# Patient Record
Sex: Male | Born: 1970 | Race: White | Hispanic: No | Marital: Married | Smoking: Former smoker
Health system: Southern US, Community
[De-identification: ages and names within clinical notes are randomized; demographics above are authoritative.]

## PROBLEM LIST (undated history)

## (undated) DIAGNOSIS — G4700 Insomnia, unspecified: Secondary | ICD-10-CM

## (undated) DIAGNOSIS — R112 Nausea with vomiting, unspecified: Secondary | ICD-10-CM

## (undated) DIAGNOSIS — E785 Hyperlipidemia, unspecified: Secondary | ICD-10-CM

## (undated) DIAGNOSIS — M5090 Cervical disc disorder, unspecified, unspecified cervical region: Secondary | ICD-10-CM

## (undated) DIAGNOSIS — K219 Gastro-esophageal reflux disease without esophagitis: Secondary | ICD-10-CM

## (undated) DIAGNOSIS — Z9289 Personal history of other medical treatment: Secondary | ICD-10-CM

## (undated) DIAGNOSIS — I499 Cardiac arrhythmia, unspecified: Secondary | ICD-10-CM

## (undated) DIAGNOSIS — Z9889 Other specified postprocedural states: Secondary | ICD-10-CM

## (undated) DIAGNOSIS — I480 Paroxysmal atrial fibrillation: Secondary | ICD-10-CM

## (undated) DIAGNOSIS — M1990 Unspecified osteoarthritis, unspecified site: Secondary | ICD-10-CM

## (undated) DIAGNOSIS — I10 Essential (primary) hypertension: Secondary | ICD-10-CM

## (undated) DIAGNOSIS — Z8489 Family history of other specified conditions: Secondary | ICD-10-CM

## (undated) HISTORY — DX: Cervical disc disorder, unspecified, unspecified cervical region: M50.90

## (undated) HISTORY — DX: Paroxysmal atrial fibrillation: I48.0

## (undated) HISTORY — DX: Insomnia, unspecified: G47.00

## (undated) HISTORY — DX: Hyperlipidemia, unspecified: E78.5

## (undated) HISTORY — DX: Gastro-esophageal reflux disease without esophagitis: K21.9

## (undated) HISTORY — PX: KNEE ARTHROSCOPY: SHX127

## (undated) HISTORY — DX: Personal history of other medical treatment: Z92.89

## (undated) HISTORY — DX: Essential (primary) hypertension: I10

## (undated) HISTORY — PX: OTHER SURGICAL HISTORY: SHX169

---
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
## 1999-06-09 ENCOUNTER — Ambulatory Visit (HOSPITAL_COMMUNITY): Admission: RE | Admit: 1999-06-09 | Discharge: 1999-06-09 | Payer: Self-pay | Admitting: Orthopedic Surgery

## 1999-06-09 ENCOUNTER — Encounter: Payer: Self-pay | Admitting: Orthopedic Surgery

## 1999-07-11 ENCOUNTER — Ambulatory Visit (HOSPITAL_BASED_OUTPATIENT_CLINIC_OR_DEPARTMENT_OTHER): Admission: RE | Admit: 1999-07-11 | Discharge: 1999-07-11 | Payer: Self-pay | Admitting: Orthopedic Surgery

## 2000-07-28 ENCOUNTER — Encounter: Payer: Self-pay | Admitting: Family Medicine

## 2000-07-28 ENCOUNTER — Ambulatory Visit (HOSPITAL_COMMUNITY): Admission: RE | Admit: 2000-07-28 | Discharge: 2000-07-28 | Payer: Self-pay | Admitting: Family Medicine

## 2000-10-16 ENCOUNTER — Ambulatory Visit (HOSPITAL_COMMUNITY): Admission: RE | Admit: 2000-10-16 | Discharge: 2000-10-16 | Payer: Self-pay | Admitting: Family Medicine

## 2000-10-16 ENCOUNTER — Encounter: Payer: Self-pay | Admitting: Family Medicine

## 2001-07-06 ENCOUNTER — Emergency Department (HOSPITAL_COMMUNITY): Admission: EM | Admit: 2001-07-06 | Discharge: 2001-07-06 | Payer: Self-pay | Admitting: Emergency Medicine

## 2001-07-06 ENCOUNTER — Encounter: Payer: Self-pay | Admitting: Emergency Medicine

## 2003-05-16 ENCOUNTER — Ambulatory Visit (HOSPITAL_BASED_OUTPATIENT_CLINIC_OR_DEPARTMENT_OTHER): Admission: RE | Admit: 2003-05-16 | Discharge: 2003-05-16 | Payer: Self-pay | Admitting: Orthopedic Surgery

## 2003-11-16 ENCOUNTER — Ambulatory Visit (HOSPITAL_BASED_OUTPATIENT_CLINIC_OR_DEPARTMENT_OTHER): Admission: RE | Admit: 2003-11-16 | Discharge: 2003-11-16 | Payer: Self-pay | Admitting: Orthopedic Surgery

## 2004-06-01 HISTORY — PX: CARDIAC CATHETERIZATION: SHX172

## 2009-12-14 ENCOUNTER — Encounter: Admission: RE | Admit: 2009-12-14 | Discharge: 2009-12-14 | Payer: Self-pay | Admitting: Cardiovascular Disease

## 2010-10-05 NOTE — Op Note (Signed)
NAME:  Jimmy Baldwin, Jimmy Baldwin                       ACCOUNT NO.:  [id number]   MEDICAL RECORD NO.:  [id number]                   PATIENT TYPE:  AMB   LOCATION:  DSC                                  FACILITY:  [facility]   PHYSICIAN:  Harvie Junior, M.D.                DATE OF BIRTH:  08/26/1970   DATE OF PROCEDURE:  05/16/2003  DATE OF DISCHARGE:                                 OPERATIVE REPORT   PREOPERATIVE DIAGNOSIS:  Impingement left shoulder.   POSTOPERATIVE DIAGNOSES:  1. Impingement left shoulder.  2. Anterior labral tear.   PROCEDURES:  1. Anterolateral acromioplasty.  2. Debridement of anterior labral tear.   SURGEON:  Harvie Junior, M.D.   ASSISTANT:  Marshia Ly, P.A.   ANESTHESIA:  General.   INDICATIONS FOR PROCEDURE:  He is a 40 year old male with a long history of  having had a right shoulder subacromial decompression. He has done well with  that. He began having left shoulder symptoms. We ultimately treated him  conservatively and with failure of conservative care we ultimately talked  about taking him to the operating room for arthroscopic acromioplasty, given  that he had a type acromion with failure of conservative care. He is brought  to the operating room for this procedure.   DESCRIPTION OF PROCEDURE:  The patient was brought to the operating room and  after adequate anesthesia was obtained with general anesthesia the patient  was placed supine on the operating table. The left shoulder was prepped and  draped in the usual sterile fashion.   Following this an arthroscopic examination  of the bony humeral joint  revealed there was an obvious anterior labral tear which was debrided. The  rotator cuff undersurface was evaluated and noted to be within normal  limits.   Attention was turned out of the glenohumeral joint and into the subacromial  space where an anterolateral acromioplasty was performed. A subtotal  bursectomy was then performed. The shoulder was  put through a range of  motion and there were no limitations to motion, no impingement noted at this  point.   Following  this the patient was taken to the recovery room where he was  noted to be in satisfactory condition. Estimated blood loss for this  procedure was none.                                               Harvie Junior, M.D.   Ranae Plumber  D:  05/16/2003  T:  05/16/2003  Job:  [id number]

## 2010-10-05 NOTE — Op Note (Signed)
NAME:  Jimmy Baldwin, Jimmy Baldwin                       ACCOUNT NO.:  [id number]   MEDICAL RECORD NO.:  [id number]                   PATIENT TYPE:  AMB   LOCATION:  DSC                                  FACILITY:  [facility]   PHYSICIAN:  Harvie Junior, M.D.                DATE OF BIRTH:  August 25, 1970   DATE OF PROCEDURE:  11/16/2003  DATE OF DISCHARGE:                                 OPERATIVE REPORT   PREOPERATIVE DIAGNOSIS:  Acromioclavicular joint arthritis, impingement,  possible rotator cuff tear.   POSTOPERATIVE DIAGNOSIS:  1. Rotator cuff tear.  2. Acromioclavicular joint arthritis.   OPERATION PERFORMED:  1. Miniopen rotator cuff repair of a chronically partially torn rotator     cuff.  2. Distal clavicle resection through an anterior arthroscopic portal.   SURGEON:  Harvie Junior, M.D.   ASSISTANT:  Marshia Ly, P.A.   ANESTHESIA:  General.   INDICATIONS FOR PROCEDURE:  The patient is a 40 year old male with a long  history of having had left shoulder pain.  He was evaluated and underwent  subacromial decompression with bursectomy.  We did note at that time that he  had a small partial thickness rotator cuff tear.  This had been left alone.  He postoperatively had had physical therapy and continued to have pain in  the lateral aspect of the shoulder.  Repeat MRI showed no full thickness  tearing area.  Because of continued complaints of pain, failure of  conservative care, he was ultimately taken back for repeat evaluation.   DESCRIPTION OF PROCEDURE:  The patient was taken to the operating room.  After adequate anesthesia was obtained with a general anesthetic, the  patient was placed on the operating table.  The left shoulder was then  prepped and draped in the usual sterile fashion.  Following this, routine  arthroscopic examination of the shoulder revealed biceps tendon was within  normal limits.  A little bit of tear of the superior labrum which was  debrided. The  anterior capsular structures were fine.  No glenohumeral  arthritis.  Attention was turned to the rotator cuff where the small area  which we had seen before was identified and we debrided this and with  debridement, did appear to be a small, probably 7 to 8 mm full thickness  rotator cuff tear at least from the humeral size.  We put a PDS suture in  this area and then evaluated this from the top side.  On the top side it did  appear to have a small full thickness tear.  At this point, the  acromioplasty was performed.  Distal clavicle resection was performed  through an anterior portal and then attention was turned towards a mini open  rotator cuff repair.  A small incision was made over the lateral aspect of  the shoulder and the deltoid was identified and divided and the rotator cuff  tear was identified.  A small incision was made in the rotator cuff to make  about a 1 cm tear and the joining underneath sections were debrided.  The  bone was debrided with a rongeur and a 5 mm suture anchor was placed at this  point and anterior and posterior sutures were placed, repairing the rotator  cuff.  At this point the arm was put through a range of motion, no tendency  toward impingement or instability.  Shoulder was copiously irrigated and  suctioned dry.  The deltoid rent was closed with a  1 Vicryl running suture, the subcu with 0 and 2-0 Vicryl and 3-0 Maxon  subcuticular stitch.  Benzoin and Steri-Strips were applied.  Sterile  compressive dressing and the patient was taken to recovery room where he was  noted to be in satisfactory condition.  The estimated blood loss was none.                                               Harvie Junior, M.D.    Ranae Plumber  D:  11/16/2003  T:  11/16/2003  Job:  [id number]

## 2010-10-05 NOTE — Op Note (Signed)
[facility]  Patient:    Jimmy Baldwin, Jimmy Baldwin                    MRN: [id number] Proc. Date: 07/11/99 Adm. Date:  [id number] Attending:  Milly Jakob CC:         Harvie Junior, M.D.                           Operative Report  PREOPERATIVE DIAGNOSIS: 1. Left knee patellofemoral pain with chondral injury. 2. Concern of meniscal tear. 3. Right knee anterior cruciate ligament laxity and concern over chondral    injury.  POSTOPERATIVE DIAGNOSIS: 1. Anterior cruciate ligament laxity status post anterior cruciate ligament    reconstruction 10 years ago. 2. Chondral injury, lateral femoral condyle. 3. Lateral meniscal tear.  OPERATION PERFORMED: 1. Debridement of lateral patellar chondromalacia. 2. Debridement of anterior horn medial meniscal tear. Right knee: 1. Debridement of posterior horn lateral meniscal tear. 2. Debridement of chondromalacia lateral femoral condyle. 3. Anterior cruciate ligament thermal modification.  SURGEON:  Harvie Junior, M.D.  ANESTHESIA:  General.  INDICATIONS FOR PROCEDURE:  The patient is a 40 year old male with a long history of having significant bilateral knee pain.  He has had previous surgery on the right knee including an initial debridement and then ultimately an ACL reconstruction.  He has done reasonably well although he has had two episodes of giving out.  He has had a lot of anterior knee pain on the right side with a lot of joint line pain, particularly laterally.  In terms of his left knee, he started  having worsening pain on that most recently with pain around the area of the patella on the lateral side and also on the anteromedial side.  Because of these findings, the patient was brought to the operating room for arthroscopy.  We talked about staging the procedures but the patient did wish to have both done at once and we did clue him in to the risk of the possibility of needing to be in  a wheelchair. He wanted to accept this risk and have both procedures performed at once and this was what was undertaken.  DESCRIPTION OF PROCEDURE:  The patient was taken to the operating room and after adequate anesthesia obtained with general anesthetic, the patient was placed supine on the operating table.  Both legs were then prepped and draped in the usual sterile fashion.  The left knee was addressed first by routine arthroscopy.  It was noted to have chondral fray in the lateral patellar facet.  This was debrided with a suction shaver and smoothed down with the suction shaver and attention was turned to the patellar tracking which was noted to be midline.  Attention was then turned to the medial compartment where there was noted to be an anterior medial meniscal tear.  This was debrided with a suction shaver.  The anterior cruciate was identified and noted to be normal.  The lateral compartment was identified at length and noted to have no full thickness chondral injury or softening.  The meniscus on the lateral side was within normal limits.  At this point the left nee was copiously irrigated and suctioned dry and attention was then turned to the right knee.  The right knee underwent a routine arthroscopic examination revealing the patellofemoral joint without significant abnormality.  There was midline tracking. The medial compartment was entered and noted to  have a dramatic medial shelf plica. This was debrided to gain access into the medial compartment.  This did track down and have communication with the anterior horn medial meniscus which was debrided. Following this, attention was turned to the notch where there was noted to be a  very lax anterior cruciate ligament.  Some synovium surrounding the fibers but ery lax ligament.  Following this, attention was turned to the lateral side where there was noted to be an osteochondral defect in the lateral femoral  condyle and a posterior horn lateral meniscal tear.  The camera was changed to the medial compartment and probe used in the lateral compartment, the meniscus tear was felt to be stable but did have a leading edge which appeared to be impinging in the condyle.  This was debrided with a straight-biting forcep and the meniscal rim as contoured down with a suction shaver.  Following this, attention was turned to he anterior portion of the lateral compartment where there was noted to be chondral injury.  This was debrided with a suction shaver and then the thermal modification probe was used to smooth the articular cartilage, care being taken not to leave  this on for any significant length of time.  Following this, the synovial tissue around the anterior cruciate ligament was removed and the anterior cruciate ligament was identified at that point.  It was noted to have significant laxity  through anterior drawer testing as well as laxity with a probe.  Following this, the thermal modification probe was used on the 5 mm Jowana Thumma setting and the anterior cruciate ligament fibers were shrunk with gentle technique of painting the fibers.  After every few passes, the knee was put through a Lachman test and noted to be not too tight.  The knee was put through a range of motion of 90 to 0 and  noted to have no significant overtightening.  With the knee in 30 degrees of flexion after thermal modification was undertaken, the ligament was probed and elt to be stable.  Following this, a final check was made, both medial and lateral  the patellofemoral compartment and the knee was copiously irrigated and suctioned dry.  The arthroscopic portals were then closed with Steri-Strips on both knees. 15 cc of 0.25% Marcaine was instilled into both knees for postoperative anesthesia. The patient was taken to the recovery room with sterile compressive dressings on both sides, knee immobilizer on the  right side, where he was noted to be in satisfactory condition.  Estimated blood loss for this procedure was none. DD:  07/11/99 TD:  07/11/99 Job: [id number]

## 2011-03-07 DIAGNOSIS — Z9289 Personal history of other medical treatment: Secondary | ICD-10-CM

## 2011-03-07 HISTORY — DX: Personal history of other medical treatment: Z92.89

## 2011-06-20 ENCOUNTER — Ambulatory Visit (INDEPENDENT_AMBULATORY_CARE_PROVIDER_SITE_OTHER)
Admission: RE | Admit: 2011-06-20 | Discharge: 2011-06-20 | Disposition: A | Discharge status: Home / Self Care | Payer: BC Managed Care – PPO | Source: Ambulatory Visit | Attending: Internal Medicine | Admitting: Internal Medicine

## 2011-06-20 ENCOUNTER — Encounter: Payer: Self-pay | Admitting: Internal Medicine

## 2011-06-20 ENCOUNTER — Other Ambulatory Visit (INDEPENDENT_AMBULATORY_CARE_PROVIDER_SITE_OTHER): Payer: BC Managed Care – PPO

## 2011-06-20 ENCOUNTER — Ambulatory Visit (INDEPENDENT_AMBULATORY_CARE_PROVIDER_SITE_OTHER): Payer: BC Managed Care – PPO | Admitting: Internal Medicine

## 2011-06-20 VITALS — BP 112/72 | HR 64 | Temp 97.1°F | Ht 69.0 in | Wt 214.4 lb

## 2011-06-20 DIAGNOSIS — R062 Wheezing: Secondary | ICD-10-CM

## 2011-06-20 DIAGNOSIS — Z0001 Encounter for general adult medical examination with abnormal findings: Secondary | ICD-10-CM | POA: Insufficient documentation

## 2011-06-20 DIAGNOSIS — K219 Gastro-esophageal reflux disease without esophagitis: Secondary | ICD-10-CM | POA: Insufficient documentation

## 2011-06-20 DIAGNOSIS — Z0000 Encounter for general adult medical examination without abnormal findings: Secondary | ICD-10-CM

## 2011-06-20 DIAGNOSIS — M5030 Other cervical disc degeneration, unspecified cervical region: Secondary | ICD-10-CM | POA: Insufficient documentation

## 2011-06-20 DIAGNOSIS — E785 Hyperlipidemia, unspecified: Secondary | ICD-10-CM

## 2011-06-20 HISTORY — DX: Hyperlipidemia, unspecified: E78.5

## 2011-06-20 LAB — LIPID PANEL
HDL: 38.4 mg/dL — ABNORMAL LOW (ref >39.00)
Total CHOL/HDL Ratio: 5
Triglycerides: 272 mg/dL — ABNORMAL HIGH (ref 0.0–149.0)
VLDL: 54.4 mg/dL — ABNORMAL HIGH (ref 0.0–40.0)

## 2011-06-20 LAB — BASIC METABOLIC PANEL
Calcium: 9.6 mg/dL (ref 8.4–10.5)
GFR: 92.41 mL/min (ref >60.00)
Glucose, Bld: 86 mg/dL (ref 70–99)
Sodium: 139 mEq/L (ref 135–145)

## 2011-06-20 LAB — CBC WITH DIFFERENTIAL/PLATELET
Basophils Absolute: 0.1 10*3/uL (ref 0.0–0.1)
HCT: 44.7 % (ref 39.0–52.0)
Hemoglobin: 15.6 g/dL (ref 13.0–17.0)
Lymphs Abs: 2.1 10*3/uL (ref 0.7–4.0)
Monocytes Relative: 8.1 % (ref 3.0–12.0)
Neutro Abs: 4.2 10*3/uL (ref 1.4–7.7)
RDW: 11.9 % (ref 11.5–14.6)

## 2011-06-20 LAB — HEPATIC FUNCTION PANEL
Albumin: 4.8 g/dL (ref 3.5–5.2)
Alkaline Phosphatase: 84 U/L (ref 39–117)
Bilirubin, Direct: 0.2 mg/dL (ref 0.0–0.3)
Total Bilirubin: 1.1 mg/dL (ref 0.3–1.2)

## 2011-06-20 LAB — URINALYSIS, ROUTINE W REFLEX MICROSCOPIC
Ketones, ur: NEGATIVE
Specific Gravity, Urine: 1.025 (ref 1.000–1.030)
Urine Glucose: NEGATIVE
pH: 6 (ref 5.0–8.0)

## 2011-06-20 LAB — PSA: PSA: 0.29 ng/mL (ref 0.10–4.00)

## 2011-06-20 MED ORDER — ROSUVASTATIN CALCIUM 10 MG PO TABS: ORAL_TABLET | ORAL | Status: DC

## 2011-06-20 MED ORDER — PREDNISONE 10 MG PO TABS: ORAL_TABLET | ORAL | Status: DC

## 2011-06-20 NOTE — Assessment & Plan Note (Signed)
With dyspnea, exam with mild findings at best, suspect recent URI/bronchits possibly viral with mild wheezing since;  For predpack asd, but also for cxr, ecg, and labs (to also to be forwarded to Dr Henrine Screws)

## 2011-06-20 NOTE — Progress Notes (Signed)
Subjective:    Patient ID: Jimmy Baldwin, male    DOB: 12-12-1970, 41 y.o.   MRN: [id number]  HPI  Here for wellness and f/u;  Overall doing ok;  Pt denies CP, worsening SOB, DOE, wheezing, orthopnea, PND, worsening LE edema, palpitations, dizziness or syncope.  Pt denies neurological change such as new Headache, facial or extremity weakness.  Pt denies polydipsia, polyuria, or low sugar symptoms. Pt states overall good compliance with treatment and medications, good tolerability, and trying to follow lower cholesterol diet.  Pt denies worsening depressive symptoms, suicidal ideation or panic. No fever, wt loss, night sweats, loss of appetite, or other constitutional symptoms.  Pt states good ability with ADL's, low fall risk, home safety reviewed and adequate, no significant changes in hearing or vision, and occasionally active with exercise.  Here with sob/wheezing unusual for him for 2wks, with nighttime and latr in the day is worse. No signficant nasal allergy sz/ No hx of asthma or other pulm dz. Past Medical History  Diagnosis Date  . Cervical disc disease     per MRI july 2011  . GERD (gastroesophageal reflux disease)   . Hyperlipidemia 06/20/2011   Past Surgical History  Procedure Date  . Left shoulder surgury     x 3  . Right shoudler surgury     x 1  . Right knee surgury      x 4  - twice with ACL tear    reports that he has quit smoking. He has never used smokeless tobacco. He reports that he does not drink alcohol or use illicit drugs. family history includes Diabetes in his other; Heart disease in his other; and Hypertension in his other. No Known Allergies No current outpatient prescriptions on file prior to visit.   Review of Systems Review of Systems  Constitutional: Negative for diaphoresis, activity change, appetite change and unexpected weight change.  HENT: Negative for hearing loss, ear pain, facial swelling, mouth sores and neck stiffness.   Eyes: Negative for  pain, redness and visual disturbance.  Respiratory: Negative for shortness of breath and wheezing.   Cardiovascular: Negative for chest pain and palpitations.  Gastrointestinal: Negative for diarrhea, blood in stool, abdominal distention and rectal pain.  Genitourinary: Negative for hematuria, flank pain and decreased urine volume.  Musculoskeletal: Negative for myalgias and joint swelling.  Skin: Negative for color change and wound.  Neurological: Negative for syncope and numbness.  Hematological: Negative for adenopathy.  Psychiatric/Behavioral: Negative for hallucinations, self-injury, decreased concentration and agitation.      Objective:   Physical Exam BP 112/72  Pulse 64  Temp(Src) 97.1 F (36.2 C) (Oral)  Ht 5\' 9"  (1.753 m)  Wt 214 lb 6 oz (97.24 kg)  BMI 31.66 kg/m2  SpO2 97% Physical Exam  VS noted, not ill appearing Constitutional: Pt is oriented to person, place, and time. Appears well-developed and well-nourished.  HENT:  Head: Normocephalic and atraumatic.  Right Ear: External ear normal.  Left Ear: External ear normal.  Nose: Nose normal.  Mouth/Throat: Oropharynx is clear and moist. Bilat tm's mild erythema.  Sinus nontender.  Pharynx mild erythema  Eyes: Conjunctivae and EOM are normal. Pupils are equal, round, and reactive to light.  Neck: Normal range of motion. Neck supple. No JVD present. No tracheal deviation present.  Cardiovascular: Normal rate, regular rhythm, normal heart sounds and intact distal pulses.   Pulmonary/Chest: Effort normal and breath sounds mild decreased bilat, no rales or wheezing  Abdominal: Soft. Bowel sounds  are normal. There is no tenderness.  Musculoskeletal: Normal range of motion. Exhibits no edema.  Lymphadenopathy:  Has no cervical adenopathy.  Neurological: Pt is alert and oriented to person, place, and time. Pt has normal reflexes. No cranial nerve deficit.  Skin: Skin is warm and dry. No rash noted.  Psychiatric:  Has   normal mood and affect. Behavior is normal.     Assessment & Plan:

## 2011-06-20 NOTE — Patient Instructions (Addendum)
Your EKG was normal today Take all new medications as prescribed  - the prednisone - (sent to your pharmacy) Please go to XRAY in the Basement for the x-ray test Please go to LAB in the Basement for the blood and/or urine tests to be done today Please call the phone number [phone] (the [employer] System) for results of testing in 2-3 days;  When calling, simply dial the number, and when prompted enter the MRN number above (the Medical Record Number) and the # key, then the message should start. Please keep your appointments with your specialists as you have planned - Dr Allyson Sabal; (we will fax todays labs and cxr and ecg)  Please return in 1 year for your yearly visit, or sooner if needed, with Lab testing done 3-5 days before

## 2011-06-20 NOTE — Assessment & Plan Note (Signed)

## 2011-06-22 ENCOUNTER — Encounter: Payer: Self-pay | Admitting: Internal Medicine

## 2012-01-16 ENCOUNTER — Other Ambulatory Visit: Payer: Self-pay | Admitting: Orthopedic Surgery

## 2012-01-16 ENCOUNTER — Encounter (HOSPITAL_BASED_OUTPATIENT_CLINIC_OR_DEPARTMENT_OTHER): Payer: Self-pay | Admitting: *Deleted

## 2012-01-16 NOTE — Progress Notes (Signed)
No labs needed Sees dr berry for abn ekg-stress test nl- Family hx cad

## 2012-01-17 ENCOUNTER — Encounter (HOSPITAL_BASED_OUTPATIENT_CLINIC_OR_DEPARTMENT_OTHER)
Admission: RE | Disposition: A | Discharge status: Home / Self Care | Payer: Self-pay | Source: Ambulatory Visit | Attending: Orthopedic Surgery

## 2012-01-17 ENCOUNTER — Encounter (HOSPITAL_BASED_OUTPATIENT_CLINIC_OR_DEPARTMENT_OTHER): Payer: Self-pay | Admitting: *Deleted

## 2012-01-17 ENCOUNTER — Encounter (HOSPITAL_BASED_OUTPATIENT_CLINIC_OR_DEPARTMENT_OTHER): Payer: Self-pay | Admitting: Anesthesiology

## 2012-01-17 ENCOUNTER — Ambulatory Visit (HOSPITAL_BASED_OUTPATIENT_CLINIC_OR_DEPARTMENT_OTHER): Payer: BC Managed Care – PPO | Admitting: Anesthesiology

## 2012-01-17 ENCOUNTER — Ambulatory Visit (HOSPITAL_BASED_OUTPATIENT_CLINIC_OR_DEPARTMENT_OTHER)
Admission: RE | Admit: 2012-01-17 | Discharge: 2012-01-17 | Disposition: A | Discharge status: Home / Self Care | Payer: BC Managed Care – PPO | Source: Ambulatory Visit | Attending: Orthopedic Surgery | Admitting: Orthopedic Surgery

## 2012-01-17 DIAGNOSIS — K219 Gastro-esophageal reflux disease without esophagitis: Secondary | ICD-10-CM | POA: Insufficient documentation

## 2012-01-17 DIAGNOSIS — M1990 Unspecified osteoarthritis, unspecified site: Secondary | ICD-10-CM | POA: Insufficient documentation

## 2012-01-17 DIAGNOSIS — G5600 Carpal tunnel syndrome, unspecified upper limb: Secondary | ICD-10-CM | POA: Insufficient documentation

## 2012-01-17 DIAGNOSIS — E785 Hyperlipidemia, unspecified: Secondary | ICD-10-CM | POA: Insufficient documentation

## 2012-01-17 HISTORY — PX: CARPAL TUNNEL RELEASE: SHX101

## 2012-01-17 HISTORY — DX: Other specified postprocedural states: R11.2

## 2012-01-17 HISTORY — DX: Unspecified osteoarthritis, unspecified site: M19.90

## 2012-01-17 HISTORY — DX: Other specified postprocedural states: Z98.890

## 2012-01-17 SURGERY — CARPAL TUNNEL RELEASE
Anesthesia: Monitor Anesthesia Care | Site: Hand | Laterality: Left | Wound class: Clean

## 2012-01-17 MED ORDER — OXYCODONE HCL 5 MG PO TABS: 5.0000 mg | ORAL_TABLET | Freq: Once | ORAL | Status: DC | PRN

## 2012-01-17 MED ORDER — DEXTROSE 5 % IV SOLN
3.0000 g | INTRAVENOUS | Status: AC
  Administered 2012-01-17: 2 g via INTRAVENOUS

## 2012-01-17 MED ORDER — CHLORHEXIDINE GLUCONATE 4 % EX LIQD: 60.0000 mL | Freq: Once | CUTANEOUS | Status: DC

## 2012-01-17 MED ORDER — ONDANSETRON HCL 4 MG/2ML IJ SOLN
INTRAMUSCULAR | Status: DC | PRN
  Administered 2012-01-17: 4 mg via INTRAVENOUS

## 2012-01-17 MED ORDER — PENTAZOCINE-NALOXONE 50-0.5 MG PO TABS: 1.0000 | ORAL_TABLET | ORAL | Status: AC | PRN

## 2012-01-17 MED ORDER — LACTATED RINGERS IV SOLN
INTRAVENOUS | Status: DC
Start: 2012-01-17 — End: 2012-01-17
  Administered 2012-01-17: 20 mL/h via INTRAVENOUS
  Administered 2012-01-17: 12:00:00 via INTRAVENOUS

## 2012-01-17 MED ORDER — HYDROMORPHONE HCL PF 1 MG/ML IJ SOLN
0.2500 mg | INTRAMUSCULAR | Status: DC | PRN
  Administered 2012-01-17: 0.5 mg via INTRAVENOUS

## 2012-01-17 MED ORDER — BUPIVACAINE HCL (PF) 0.25 % IJ SOLN
INTRAMUSCULAR | Status: DC | PRN
  Administered 2012-01-17: 7 mL

## 2012-01-17 MED ORDER — FENTANYL CITRATE 0.05 MG/ML IJ SOLN
INTRAMUSCULAR | Status: DC | PRN
  Administered 2012-01-17: 100 ug via INTRAVENOUS

## 2012-01-17 MED ORDER — PROPOFOL 10 MG/ML IV EMUL
INTRAVENOUS | Status: DC | PRN
  Administered 2012-01-17: 50 ug/kg/min via INTRAVENOUS

## 2012-01-17 MED ORDER — FENTANYL CITRATE 0.05 MG/ML IJ SOLN
25.0000 ug | INTRAMUSCULAR | Status: DC | PRN
  Administered 2012-01-17 (×3): 50 ug via INTRAVENOUS

## 2012-01-17 MED ORDER — MIDAZOLAM HCL 5 MG/5ML IJ SOLN
INTRAMUSCULAR | Status: DC | PRN
  Administered 2012-01-17: 2 mg via INTRAVENOUS

## 2012-01-17 MED ORDER — KETOROLAC TROMETHAMINE 30 MG/ML IJ SOLN
INTRAMUSCULAR | Status: DC | PRN
  Administered 2012-01-17: 30 mg via INTRAVENOUS

## 2012-01-17 MED ORDER — OXYCODONE HCL 5 MG/5ML PO SOLN: 5.0000 mg | Freq: Once | ORAL | Status: DC | PRN

## 2012-01-17 MED ORDER — LIDOCAINE HCL (CARDIAC) 20 MG/ML IV SOLN
INTRAVENOUS | Status: DC | PRN
  Administered 2012-01-17: 50 mg via INTRAVENOUS

## 2012-01-17 MED ORDER — LIDOCAINE HCL (PF) 0.5 % IJ SOLN
INTRAMUSCULAR | Status: DC | PRN
  Administered 2012-01-17: 35 mL via INTRATHECAL

## 2012-01-17 SURGICAL SUPPLY — 36 items
BANDAGE GAUZE ELAST BULKY 4 IN (GAUZE/BANDAGES/DRESSINGS) ×2 IMPLANT
BLADE SURG 15 STRL LF DISP TIS (BLADE) ×1 IMPLANT
BLADE SURG 15 STRL SS (BLADE) ×2
BNDG CMPR 9X4 STRL LF SNTH (GAUZE/BANDAGES/DRESSINGS)
BNDG COHESIVE 3X5 TAN STRL LF (GAUZE/BANDAGES/DRESSINGS) ×2 IMPLANT
BNDG ESMARK 4X9 LF (GAUZE/BANDAGES/DRESSINGS) IMPLANT
CHLORAPREP W/TINT 26ML (MISCELLANEOUS) ×2 IMPLANT
CLOTH BEACON ORANGE TIMEOUT ST (SAFETY) ×2 IMPLANT
CORDS BIPOLAR (ELECTRODE) ×2 IMPLANT
COVER MAYO STAND STRL (DRAPES) ×2 IMPLANT
COVER TABLE BACK 60X90 (DRAPES) ×2 IMPLANT
CUFF TOURNIQUET SINGLE 18IN (TOURNIQUET CUFF) ×2 IMPLANT
DRAPE EXTREMITY T 121X128X90 (DRAPE) ×2 IMPLANT
DRAPE SURG 17X23 STRL (DRAPES) ×2 IMPLANT
DRSG KUZMA FLUFF (GAUZE/BANDAGES/DRESSINGS) ×2 IMPLANT
GAUZE XEROFORM 1X8 LF (GAUZE/BANDAGES/DRESSINGS) ×2 IMPLANT
GLOVE BIO SURGEON STRL SZ 6.5 (GLOVE) ×2 IMPLANT
GLOVE BIOGEL M STRL SZ7.5 (GLOVE) ×1 IMPLANT
GLOVE SURG ORTHO 8.0 STRL STRW (GLOVE) ×2 IMPLANT
GOWN BRE IMP PREV XXLGXLNG (GOWN DISPOSABLE) ×2 IMPLANT
GOWN PREVENTION PLUS XLARGE (GOWN DISPOSABLE) ×2 IMPLANT
NEEDLE 27GAX1X1/2 (NEEDLE) ×1 IMPLANT
NS IRRIG 1000ML POUR BTL (IV SOLUTION) ×2 IMPLANT
PACK BASIN DAY SURGERY FS (CUSTOM PROCEDURE TRAY) ×2 IMPLANT
PAD CAST 3X4 CTTN HI CHSV (CAST SUPPLIES) ×1 IMPLANT
PADDING CAST ABS 4INX4YD NS (CAST SUPPLIES) ×1
PADDING CAST ABS COTTON 4X4 ST (CAST SUPPLIES) ×1 IMPLANT
PADDING CAST COTTON 3X4 STRL (CAST SUPPLIES) ×2
SPONGE GAUZE 4X4 12PLY (GAUZE/BANDAGES/DRESSINGS) ×2 IMPLANT
STOCKINETTE 4X48 STRL (DRAPES) ×2 IMPLANT
SUT VICRYL 4-0 PS2 18IN ABS (SUTURE) IMPLANT
SUT VICRYL RAPIDE 4/0 PS 2 (SUTURE) ×2 IMPLANT
SYR BULB 3OZ (MISCELLANEOUS) ×2 IMPLANT
SYR CONTROL 10ML LL (SYRINGE) ×1 IMPLANT
TOWEL OR 17X24 6PK STRL BLUE (TOWEL DISPOSABLE) ×2 IMPLANT
UNDERPAD 30X30 INCONTINENT (UNDERPADS AND DIAPERS) ×2 IMPLANT

## 2012-01-17 NOTE — Transfer of Care (Signed)
Immediate Anesthesia Transfer of Care Note  Patient: Jimmy Baldwin  Procedure(s) Performed: Procedure(s) (LRB): CARPAL TUNNEL RELEASE (Left)  Patient Location: PACU  Anesthesia Type: MAC and Regional  Level of Consciousness: awake, alert  and oriented  Airway & Oxygen Therapy: Patient Spontanous Breathing  Post-op Assessment: Report given to PACU RN and Post -op Vital signs reviewed and stable  Post vital signs: Reviewed and stable  Complications: No apparent anesthesia complications

## 2012-01-17 NOTE — Anesthesia Postprocedure Evaluation (Signed)
  Anesthesia Post-op Note  Patient: Jimmy Baldwin  Procedure(s) Performed: Procedure(s) (LRB): CARPAL TUNNEL RELEASE (Left)  Patient Location: PACU  Anesthesia Type: MAC and Bier block  Level of Consciousness: awake, alert  and oriented  Airway and Oxygen Therapy: Patient Spontanous Breathing  Post-op Pain: mild  Post-op Assessment: Post-op Vital signs reviewed, Patient's Cardiovascular Status Stable, Respiratory Function Stable, Patent Airway and No signs of Nausea or vomiting  Post-op Vital Signs: Reviewed and stable  Complications: No apparent anesthesia complications

## 2012-01-17 NOTE — Brief Op Note (Signed)
01/17/2012  1:10 PM  PATIENT:  Jimmy Baldwin  41 y.o. male  PRE-OPERATIVE DIAGNOSIS:  left cts   POST-OPERATIVE DIAGNOSIS:  left cts   PROCEDURE:  Procedure(s) (LRB): CARPAL TUNNEL RELEASE (Left)  SURGEON:  Surgeon(s) and Role:    * Nicki Reaper, MD - Primary  PHYSICIAN ASSISTANT:   ASSISTANTS: none   ANESTHESIA:   local and regional  EBL:  Total I/O In: 500 [I.V.:500] Out: -   BLOOD ADMINISTERED:none  DRAINS: none   LOCAL MEDICATIONS USED:  MARCAINE     SPECIMEN:  No Specimen  DISPOSITION OF SPECIMEN:  N/A  COUNTS:  YES  TOURNIQUET:   Total Tourniquet Time Documented: Forearm (Left) - 79 minutes  DICTATION: .Other Dictation: Dictation Number [id number]  PLAN OF CARE: Discharge to home after PACU  PATIENT DISPOSITION:  PACU - hemodynamically stable.

## 2012-01-17 NOTE — Anesthesia Procedure Notes (Signed)
Procedure Name: MAC Performed by: Awanda Wilcock W Pre-anesthesia Checklist: Patient identified, Timeout performed, Emergency Drugs available, Suction available and Patient being monitored Patient Re-evaluated:Patient Re-evaluated prior to induction Oxygen Delivery Method: Simple face mask       

## 2012-01-17 NOTE — Op Note (Signed)
Dictated number: [id number]

## 2012-01-17 NOTE — Anesthesia Preprocedure Evaluation (Signed)
Anesthesia Evaluation  Patient identified by MRN, date of birth, ID band Patient awake    Reviewed: Allergy & Precautions, H&P , NPO status , Patient's Chart, lab work & pertinent test results  History of Anesthesia Complications (+) PONV  Airway Mallampati: I TM Distance: >3 FB Neck ROM: Full    Dental No notable dental hx. (+) Teeth Intact and Dental Advisory Given   Pulmonary neg pulmonary ROS,  breath sounds clear to auscultation  Pulmonary exam normal       Cardiovascular negative cardio ROS  Rhythm:Regular Rate:Normal     Neuro/Psych negative neurological ROS  negative psych ROS   GI/Hepatic Neg liver ROS, GERD-  Controlled,  Endo/Other  negative endocrine ROS  Renal/GU negative Renal ROS  negative genitourinary   Musculoskeletal   Abdominal   Peds  Hematology negative hematology ROS (+)   Anesthesia Other Findings   Reproductive/Obstetrics negative OB ROS                           Anesthesia Physical Anesthesia Plan  ASA: II  Anesthesia Plan: MAC and Bier Block   Post-op Pain Management:    Induction: Intravenous  Airway Management Planned: Simple Face Mask  Additional Equipment:   Intra-op Plan:   Post-operative Plan:   Informed Consent: I have reviewed the patients History and Physical, chart, labs and discussed the procedure including the risks, benefits and alternatives for the proposed anesthesia with the patient or authorized representative who has indicated his/her understanding and acceptance.   Dental advisory given  Plan Discussed with: CRNA  Anesthesia Plan Comments:         Anesthesia Quick Evaluation
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
## 2012-01-18 NOTE — Op Note (Signed)
NAME:  IZREAL, KOCK NO.:  [id number]  MEDICAL RECORD NO.:  [id number]  LOCATION:                                 FACILITY:  PHYSICIAN:  Cindee Salt, M.D.            DATE OF BIRTH:  DATE OF PROCEDURE:  01/17/2012 DATE OF DISCHARGE:                              OPERATIVE REPORT   PREOPERATIVE DIAGNOSIS:  Carpal tunnel syndrome, left hand.  POSTOPERATIVE DIAGNOSIS:  Carpal tunnel syndrome, left hand.  OPERATION:  Decompression of left median nerve.  SURGEON:  Cindee Salt, MD  ANESTHESIA:  Forearm-based IV regional with local infiltration.  ANESTHESIOLOGIST:  Zenon Mayo, MD  HISTORY:  The patient is a 41 year old male with history of carpal tunnel syndrome.  EMG nerve conduction is positive.  He has elected to undergo surgical decompression.  Pre, peri, and postoperative course have been discussed along with risks and complications.  He is aware that there is no guarantee with surgery; possibility of infection; recurrence of injury to arteries, nerves, tendons; incomplete relief of symptoms, and dystrophy.  In the preoperative area, the patient is seen, the extremity marked by both the patient and surgeon, and antibiotic given.  PROCEDURE:  The patient was brought to the operating room where forearm- based IV regional anesthetic was carried out without difficulty.  He was prepped using ChloraPrep, supine position, left arm free.  A 3-minute dry time was allowed.  Time-out taken, confirming the patient and procedure.  A longitudinal incision was made in the palm, carried down through the subcutaneous tissue.  Bleeders were electrocauterized. Palmar fascia was split.  Superficial palmar arch was identified.  The flexor tendon of the ring and little finger were identified to the ulnar side of the median nerve.  Carpal retinaculum was incised with sharp dissection.  Right angle and Sewall retractor were placed between the skin and forearm fascia.   The fascia was released for approximately 1.5 cm proximal to the wrist crease under direct vision.  Canal was explored.  Air compression to the nerve.  An hourglass deformity was noted.  No further lesions were identified.  The wound was irrigated. The skin was closed with interrupted 4-0 Vicryl Rapide sutures.  Local infiltration with 0.25% Marcaine without epinephrine was given, 7 mL was used.  Sterile compressive dressing with fingers free was applied.  On deflation of the tourniquet, all fingers immediately pinked.  He was taken to the recovery room for observation in satisfactory condition.          ______________________________ Cindee Salt, M.D.     GK/MEDQ  D:  01/17/2012  T:  01/18/2012  Job:  [id number]

## 2012-01-21 ENCOUNTER — Encounter (HOSPITAL_BASED_OUTPATIENT_CLINIC_OR_DEPARTMENT_OTHER): Payer: Self-pay | Admitting: Orthopedic Surgery

## 2012-01-22 LAB — POCT HEMOGLOBIN-HEMACUE: Hemoglobin: 13 g/dL (ref 13.0–17.0)

## 2012-02-19 ENCOUNTER — Encounter (HOSPITAL_BASED_OUTPATIENT_CLINIC_OR_DEPARTMENT_OTHER): Payer: Self-pay | Admitting: *Deleted

## 2012-02-19 ENCOUNTER — Other Ambulatory Visit: Payer: Self-pay | Admitting: Orthopedic Surgery

## 2012-02-21 ENCOUNTER — Ambulatory Visit (HOSPITAL_BASED_OUTPATIENT_CLINIC_OR_DEPARTMENT_OTHER): Payer: BC Managed Care – PPO | Admitting: Certified Registered"

## 2012-02-21 ENCOUNTER — Encounter (HOSPITAL_BASED_OUTPATIENT_CLINIC_OR_DEPARTMENT_OTHER)
Admission: RE | Disposition: A | Discharge status: Home / Self Care | Payer: Self-pay | Source: Ambulatory Visit | Attending: Orthopedic Surgery

## 2012-02-21 ENCOUNTER — Encounter (HOSPITAL_BASED_OUTPATIENT_CLINIC_OR_DEPARTMENT_OTHER): Payer: Self-pay | Admitting: Certified Registered"

## 2012-02-21 ENCOUNTER — Ambulatory Visit (HOSPITAL_BASED_OUTPATIENT_CLINIC_OR_DEPARTMENT_OTHER)
Admission: RE | Admit: 2012-02-21 | Discharge: 2012-02-21 | Disposition: A | Discharge status: Home / Self Care | Payer: BC Managed Care – PPO | Source: Ambulatory Visit | Attending: Orthopedic Surgery | Admitting: Orthopedic Surgery

## 2012-02-21 ENCOUNTER — Encounter (HOSPITAL_BASED_OUTPATIENT_CLINIC_OR_DEPARTMENT_OTHER): Payer: Self-pay | Admitting: Orthopedic Surgery

## 2012-02-21 DIAGNOSIS — E785 Hyperlipidemia, unspecified: Secondary | ICD-10-CM | POA: Insufficient documentation

## 2012-02-21 DIAGNOSIS — K219 Gastro-esophageal reflux disease without esophagitis: Secondary | ICD-10-CM | POA: Insufficient documentation

## 2012-02-21 DIAGNOSIS — G5600 Carpal tunnel syndrome, unspecified upper limb: Secondary | ICD-10-CM | POA: Insufficient documentation

## 2012-02-21 HISTORY — PX: CARPAL TUNNEL RELEASE: SHX101

## 2012-02-21 SURGERY — CARPAL TUNNEL RELEASE
Anesthesia: Monitor Anesthesia Care | Site: Wrist | Laterality: Right | Wound class: Clean

## 2012-02-21 MED ORDER — DEXAMETHASONE SODIUM PHOSPHATE 4 MG/ML IJ SOLN
INTRAMUSCULAR | Status: DC | PRN
  Administered 2012-02-21: 8 mg via INTRAVENOUS

## 2012-02-21 MED ORDER — CEFAZOLIN SODIUM-DEXTROSE 2-3 GM-% IV SOLR
2.0000 g | INTRAVENOUS | Status: AC
  Administered 2012-02-21: 2 g via INTRAVENOUS

## 2012-02-21 MED ORDER — BUPIVACAINE HCL (PF) 0.25 % IJ SOLN
INTRAMUSCULAR | Status: DC | PRN
  Administered 2012-02-21: 7 mL

## 2012-02-21 MED ORDER — PROMETHAZINE HCL 12.5 MG PO TABS: 12.5000 mg | ORAL_TABLET | Freq: Four times a day (QID) | ORAL | Status: DC | PRN

## 2012-02-21 MED ORDER — FENTANYL CITRATE 0.05 MG/ML IJ SOLN
INTRAMUSCULAR | Status: DC | PRN
  Administered 2012-02-21: 100 ug via INTRAVENOUS

## 2012-02-21 MED ORDER — LACTATED RINGERS IV SOLN
INTRAVENOUS | Status: DC
  Administered 2012-02-21: 13:00:00 via INTRAVENOUS

## 2012-02-21 MED ORDER — MIDAZOLAM HCL 5 MG/5ML IJ SOLN
INTRAMUSCULAR | Status: DC | PRN
  Administered 2012-02-21: 2 mg via INTRAVENOUS

## 2012-02-21 MED ORDER — CHLORHEXIDINE GLUCONATE 4 % EX LIQD: 60.0000 mL | Freq: Once | CUTANEOUS | Status: DC

## 2012-02-21 MED ORDER — PROPOFOL 10 MG/ML IV EMUL
INTRAVENOUS | Status: DC | PRN
  Administered 2012-02-21: 100 ug/kg/min via INTRAVENOUS

## 2012-02-21 MED ORDER — FENTANYL CITRATE 0.05 MG/ML IJ SOLN
25.0000 ug | INTRAMUSCULAR | Status: DC | PRN
  Administered 2012-02-21 (×2): 50 ug via INTRAVENOUS

## 2012-02-21 MED ORDER — SCOPOLAMINE 1 MG/3DAYS TD PT72
1.0000 | MEDICATED_PATCH | TRANSDERMAL | Status: DC
  Administered 2012-02-21: 1.5 mg via TRANSDERMAL

## 2012-02-21 MED ORDER — ONDANSETRON HCL 4 MG/2ML IJ SOLN
INTRAMUSCULAR | Status: DC | PRN
  Administered 2012-02-21: 4 mg via INTRAVENOUS

## 2012-02-21 SURGICAL SUPPLY — 35 items
BANDAGE GAUZE ELAST BULKY 4 IN (GAUZE/BANDAGES/DRESSINGS) ×2 IMPLANT
BLADE SURG 15 STRL LF DISP TIS (BLADE) ×1 IMPLANT
BLADE SURG 15 STRL SS (BLADE) ×2
BNDG CMPR 9X4 STRL LF SNTH (GAUZE/BANDAGES/DRESSINGS)
BNDG COHESIVE 3X5 TAN STRL LF (GAUZE/BANDAGES/DRESSINGS) ×2 IMPLANT
BNDG ESMARK 4X9 LF (GAUZE/BANDAGES/DRESSINGS) IMPLANT
CHLORAPREP W/TINT 26ML (MISCELLANEOUS) ×2 IMPLANT
CLOTH BEACON ORANGE TIMEOUT ST (SAFETY) ×2 IMPLANT
CORDS BIPOLAR (ELECTRODE) ×2 IMPLANT
COVER MAYO STAND STRL (DRAPES) ×2 IMPLANT
COVER TABLE BACK 60X90 (DRAPES) ×2 IMPLANT
CUFF TOURNIQUET SINGLE 18IN (TOURNIQUET CUFF) ×2 IMPLANT
DRAPE EXTREMITY T 121X128X90 (DRAPE) ×2 IMPLANT
DRAPE SURG 17X23 STRL (DRAPES) ×2 IMPLANT
DRSG KUZMA FLUFF (GAUZE/BANDAGES/DRESSINGS) ×2 IMPLANT
GAUZE XEROFORM 1X8 LF (GAUZE/BANDAGES/DRESSINGS) ×2 IMPLANT
GLOVE BIO SURGEON STRL SZ 6.5 (GLOVE) ×2 IMPLANT
GLOVE SURG ORTHO 8.0 STRL STRW (GLOVE) ×2 IMPLANT
GOWN BRE IMP PREV XXLGXLNG (GOWN DISPOSABLE) ×2 IMPLANT
GOWN PREVENTION PLUS XLARGE (GOWN DISPOSABLE) ×2 IMPLANT
NEEDLE 27GAX1X1/2 (NEEDLE) IMPLANT
NS IRRIG 1000ML POUR BTL (IV SOLUTION) ×2 IMPLANT
PACK BASIN DAY SURGERY FS (CUSTOM PROCEDURE TRAY) ×2 IMPLANT
PAD CAST 3X4 CTTN HI CHSV (CAST SUPPLIES) ×1 IMPLANT
PADDING CAST ABS 4INX4YD NS (CAST SUPPLIES) ×1
PADDING CAST ABS COTTON 4X4 ST (CAST SUPPLIES) ×1 IMPLANT
PADDING CAST COTTON 3X4 STRL (CAST SUPPLIES) ×2
SPONGE GAUZE 4X4 12PLY (GAUZE/BANDAGES/DRESSINGS) ×2 IMPLANT
STOCKINETTE 4X48 STRL (DRAPES) ×2 IMPLANT
SUT VICRYL 4-0 PS2 18IN ABS (SUTURE) IMPLANT
SUT VICRYL RAPIDE 4/0 PS 2 (SUTURE) ×2 IMPLANT
SYR BULB 3OZ (MISCELLANEOUS) ×2 IMPLANT
SYR CONTROL 10ML LL (SYRINGE) IMPLANT
TOWEL OR 17X24 6PK STRL BLUE (TOWEL DISPOSABLE) ×2 IMPLANT
UNDERPAD 30X30 INCONTINENT (UNDERPADS AND DIAPERS) ×2 IMPLANT
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
## 2012-02-21 NOTE — Brief Op Note (Signed)
02/21/2012  2:10 PM  PATIENT:  Jimmy Baldwin  41 y.o. male  PRE-OPERATIVE DIAGNOSIS:  right carpal tunnel syndrome   POST-OPERATIVE DIAGNOSIS:  right carpal tunnel syndrome  PROCEDURE:  Procedure(s) (LRB) with comments: CARPAL TUNNEL RELEASE (Right)  SURGEON:  Surgeon(s) and Role:    * Nicki Reaper, MD - Primary  PHYSICIAN ASSISTANT:   ASSISTANTS: none   ANESTHESIA:   local and regional  EBL:     BLOOD ADMINISTERED:none  DRAINS: none   LOCAL MEDICATIONS USED:  MARCAINE     SPECIMEN:  No Specimen  DISPOSITION OF SPECIMEN:  N/A  COUNTS:  YES  TOURNIQUET:   Total Tourniquet Time Documented: Forearm (Right) - 15 minutes  DICTATION: .Other Dictation: Dictation Number [id number]  PLAN OF CARE: Discharge to home after PACU  PATIENT DISPOSITION:  PACU - hemodynamically stable.

## 2012-02-21 NOTE — Anesthesia Postprocedure Evaluation (Signed)
  Anesthesia Post-op Note  Patient: Jimmy Baldwin  Procedure(s) Performed: Procedure(s) (LRB) with comments: CARPAL TUNNEL RELEASE (Right)  Patient Location: PACU  Anesthesia Type: MAC and Bier block  Level of Consciousness: awake, alert  and oriented  Airway and Oxygen Therapy: Patient Spontanous Breathing  Post-op Pain: mild  Post-op Assessment: Post-op Vital signs reviewed, Patient's Cardiovascular Status Stable, Respiratory Function Stable, Patent Airway and No signs of Nausea or vomiting  Post-op Vital Signs: Reviewed and stable  Complications: No apparent anesthesia complications

## 2012-02-21 NOTE — Op Note (Signed)
Dictation Number [id number]

## 2012-02-21 NOTE — Anesthesia Preprocedure Evaluation (Signed)
Anesthesia Evaluation  Patient identified by MRN, date of birth, ID band Patient awake    Reviewed: Allergy & Precautions, H&P , NPO status , Patient's Chart, lab work & pertinent test results  History of Anesthesia Complications (+) PONV  Airway Mallampati: II TM Distance: >3 FB Neck ROM: Full    Dental No notable dental hx. (+) Teeth Intact and Dental Advisory Given   Pulmonary neg pulmonary ROS,  breath sounds clear to auscultation  Pulmonary exam normal       Cardiovascular negative cardio ROS  Rhythm:Regular Rate:Normal     Neuro/Psych negative neurological ROS  negative psych ROS   GI/Hepatic Neg liver ROS, GERD-  Controlled and Medicated,  Endo/Other  negative endocrine ROS  Renal/GU negative Renal ROS  negative genitourinary   Musculoskeletal   Abdominal   Peds  Hematology negative hematology ROS (+)   Anesthesia Other Findings   Reproductive/Obstetrics negative OB ROS                           Anesthesia Physical Anesthesia Plan  ASA: II  Anesthesia Plan: MAC and Bier Block   Post-op Pain Management:    Induction: Intravenous  Airway Management Planned: Simple Face Mask  Additional Equipment:   Intra-op Plan:   Post-operative Plan:   Informed Consent: I have reviewed the patients History and Physical, chart, labs and discussed the procedure including the risks, benefits and alternatives for the proposed anesthesia with the patient or authorized representative who has indicated his/her understanding and acceptance.   Dental advisory given  Plan Discussed with: CRNA  Anesthesia Plan Comments:         Anesthesia Quick Evaluation

## 2012-02-21 NOTE — Transfer of Care (Signed)
Immediate Anesthesia Transfer of Care Note  Patient: Jimmy Baldwin  Procedure(s) Performed: Procedure(s) (LRB) with comments: CARPAL TUNNEL RELEASE (Right)  Patient Location: PACU  Anesthesia Type: Bier block  Level of Consciousness: awake and alert   Airway & Oxygen Therapy: Patient Spontanous Breathing and Patient connected to face mask oxygen  Post-op Assessment: Report given to PACU RN and Post -op Vital signs reviewed and stable  Post vital signs: Reviewed and stable  Complications: No apparent anesthesia complications

## 2012-02-22 NOTE — Op Note (Signed)
Jimmy Baldwin, Jimmy Baldwin NO.:  [id number]  MEDICAL RECORD NO.:  [id number]  LOCATION:                                 FACILITY:  PHYSICIAN:  Cindee Salt, M.D.            DATE OF BIRTH:  DATE OF PROCEDURE:  02/21/2012 DATE OF DISCHARGE:                              OPERATIVE REPORT   PREOPERATIVE DIAGNOSIS:  Carpal tunnel syndrome, right hand.  POSTOPERATIVE DIAGNOSIS:  Carpal tunnel syndrome, right hand.  OPERATION:  Decompression, right median nerve.  SURGEON:  Cindee Salt, M.D.  ANESTHESIA:  Forearm-based IV regional with local infiltration.  ANESTHESIOLOGIST:  Zenon Mayo, MD  HISTORY:  The patient is a 41 year old male with a history of bilateral carpal tunnel syndrome.  He has undergone carpal tunnel release on his left side and he is admitted now for release of his right.  Pre, peri, and postoperative course have been discussed along with risks and complications.  He is aware that there is no guarantee with the surgery, possibility of infection; recurrence of injury to arteries, nerves, tendons, incomplete relief of symptoms, dystrophy.  In the preoperative area, the patient is seen, the extremity marked by both the patient and surgeon.  Antibiotic given.  PROCEDURE:  The patient was brought to the operating room where forearm- based IV regional anesthetic was carried out without difficulty in his right arm.  He was prepped using ChloraPrep, supine position with the right arm free.  A 3-minute dry time was allowed.  Time-out taken, confirming the patient and procedure.  A longitudinal incision was made in the palm, carried down through subcutaneous tissue.  Bleeders were electrocauterized with bipolar.  The palmar fascia was split. Superficial palmar arch identified.  The flexor tendon of the ring and little finger identified to the ulnar side of the median nerve.  The carpal retinaculum was incised with sharp dissection.  Right angle  and Sewall retractor were placed between the skin and forearm fascia.  The fascia was released for approximately 1.5 cm proximal to the wrist crease under direct vision.  Canal was explored.  Air compression to the nerve was apparent.  No further lesions were identified.  The wound was irrigated.  The skin then closed with interrupted 4-0 Vicryl Rapide sutures.  A local infiltration with 0.25% Marcaine without epinephrine was given, 6 mL was used.  Sterile compressive dressing without a splint with the fingers free was applied.  On deflation of the tourniquet, all fingers immediately pinked.  He was taken to the recovery room for observation in a satisfactory condition.  He will be discharged home to return in 1 week on Phenergan.  He has pain medicine at home.          ______________________________ Cindee Salt, M.D.     GK/MEDQ  D:  02/21/2012  T:  02/22/2012  Job:  [id number]

## 2012-02-24 ENCOUNTER — Encounter (HOSPITAL_BASED_OUTPATIENT_CLINIC_OR_DEPARTMENT_OTHER): Payer: Self-pay | Admitting: Orthopedic Surgery

## 2012-05-20 DIAGNOSIS — I480 Paroxysmal atrial fibrillation: Secondary | ICD-10-CM

## 2012-05-20 HISTORY — DX: Paroxysmal atrial fibrillation: I48.0

## 2012-10-06 ENCOUNTER — Other Ambulatory Visit: Payer: Self-pay | Admitting: Cardiovascular Disease

## 2012-10-06 NOTE — Telephone Encounter (Signed)
E prescribe the lipitor

## 2012-10-27 ENCOUNTER — Telehealth: Payer: Self-pay | Admitting: *Deleted

## 2012-10-27 NOTE — Telephone Encounter (Signed)
Pt called and LMVM re: appt.  I am not sure what appt for Dr. Pearlean Brownie or Dr. Vickey Huger relating to sleep.  I asked him to return call.

## 2012-11-02 NOTE — Telephone Encounter (Signed)
I called again and relayed is still needed to call us back.  This was a follow up call re: his last call.

## 2012-12-15 ENCOUNTER — Encounter: Payer: Self-pay | Admitting: Internal Medicine

## 2012-12-15 ENCOUNTER — Ambulatory Visit (INDEPENDENT_AMBULATORY_CARE_PROVIDER_SITE_OTHER): Payer: BC Managed Care – PPO | Admitting: Internal Medicine

## 2012-12-15 ENCOUNTER — Ambulatory Visit (INDEPENDENT_AMBULATORY_CARE_PROVIDER_SITE_OTHER): Payer: BC Managed Care – PPO

## 2012-12-15 VITALS — BP 110/72 | HR 80 | Temp 98.1°F | Ht 69.0 in | Wt 220.5 lb

## 2012-12-15 DIAGNOSIS — Z0000 Encounter for general adult medical examination without abnormal findings: Secondary | ICD-10-CM

## 2012-12-15 DIAGNOSIS — J069 Acute upper respiratory infection, unspecified: Secondary | ICD-10-CM | POA: Insufficient documentation

## 2012-12-15 DIAGNOSIS — M545 Low back pain: Secondary | ICD-10-CM

## 2012-12-15 LAB — CBC WITH DIFFERENTIAL/PLATELET
Basophils Relative: 0.3 % (ref 0.0–3.0)
Eosinophils Absolute: 0.2 10*3/uL (ref 0.0–0.7)
Hemoglobin: 14.9 g/dL (ref 13.0–17.0)
Lymphocytes Relative: 23.2 % (ref 12.0–46.0)
MCHC: 34.2 g/dL (ref 30.0–36.0)
MCV: 94.2 fl (ref 78.0–100.0)
Monocytes Absolute: 0.6 10*3/uL (ref 0.1–1.0)
Neutro Abs: 4.9 10*3/uL (ref 1.4–7.7)
RBC: 4.64 Mil/uL (ref 4.22–5.81)

## 2012-12-15 LAB — URINALYSIS, ROUTINE W REFLEX MICROSCOPIC
Hgb urine dipstick: NEGATIVE
Total Protein, Urine: NEGATIVE
Urine Glucose: NEGATIVE

## 2012-12-15 MED ORDER — CYCLOBENZAPRINE HCL 5 MG PO TABS: 5.0000 mg | ORAL_TABLET | Freq: Three times a day (TID) | ORAL | Status: DC | PRN

## 2012-12-15 MED ORDER — TRAMADOL HCL 50 MG PO TABS: 50.0000 mg | ORAL_TABLET | Freq: Four times a day (QID) | ORAL | Status: DC | PRN

## 2012-12-15 MED ORDER — AZITHROMYCIN 250 MG PO TABS: ORAL_TABLET | ORAL | Status: DC

## 2012-12-15 NOTE — Assessment & Plan Note (Signed)
C/w msk strain, for pain control/muscle relaxer prn

## 2012-12-15 NOTE — Patient Instructions (Addendum)
Please take all new medication as prescribed - the antibiotic, pain medication, and muscle relaxer Please continue all other medications as before, and refills have been done if requested. Please have the pharmacy call with any other refills you may need. Please go to the LAB in the Basement (turn left off the elevator) for the tests to be done today  You will be contacted by phone if any changes need to be made immediately.  Otherwise, you will receive a letter about your results with an explanation, but please check with MyChart first.  Please remember to sign up for My Chart if you have not done so, as this will be important to you in the future with finding out test results, communicating by private email, and scheduling acute appointments online when needed.  Please return in 1 year for your yearly visit, or sooner if needed

## 2012-12-15 NOTE — Progress Notes (Signed)
Subjective:    Patient ID: Jimmy Baldwin, male    DOB: May 16, 1971, 42 y.o.   MRN: [id number]  HPI  Here for wellness and f/u;  Overall doing ok;  Pt denies CP, worsening SOB, DOE, wheezing, orthopnea, PND, worsening LE edema, palpitations, dizziness or syncope.  Pt denies neurological change such as new headache, facial or extremity weakness.  Pt denies polydipsia, polyuria, or low sugar symptoms. Pt states overall good compliance with treatment and medications, good tolerability, and has been trying to follow lower cholesterol diet.  Pt denies worsening depressive symptoms, suicidal ideation or panic. No fever, night sweats, wt loss, loss of appetite, or other constitutional symptoms.  Pt states good ability with ADL's, has low fall risk, home safety reviewed and adequate, no other significant changes in hearing or vision, and only occasionally active with exercise.  Pt c/o left LBP mild intermittent for over a wk since working on a missions project in [place] - just returned home, no bowel or bladder change, fever, wt loss,  worsening LE pain/numbness/weakness, gait change or falls.   Also with 2-3 days acute onset fever, facial pain, pressure, headache, general weakness and malaise, with mild ST. Past Medical History  Diagnosis Date  . Cervical disc disease     per MRI july 2011  . Hyperlipidemia 06/20/2011  . GERD (gastroesophageal reflux disease)     occ tums  . DJD (degenerative joint disease)   . PONV (postoperative nausea and vomiting)    Past Surgical History  Procedure Laterality Date  . Left shoulder surgury      x 3  . Right shoudler surgury      x 1  . Right knee surgury       x 4  - twice with ACL tear  . Knee arthroscopy      lt   . Carpal tunnel release  01/17/2012    Procedure: CARPAL TUNNEL RELEASE;  Surgeon: Nicki Reaper, MD;  Location: [facility];  Service: Orthopedics;  Laterality: Left;  left carpal tunnel release  . Carpal tunnel release   02/21/2012    Procedure: CARPAL TUNNEL RELEASE;  Surgeon: Nicki Reaper, MD;  Location: [facility];  Service: Orthopedics;  Laterality: Right;    reports that he quit smoking about 9 years ago. He has never used smokeless tobacco. He reports that he does not drink alcohol or use illicit drugs. family history includes Diabetes in his other; Heart disease in his other; and Hypertension in his other. No Known Allergies Current Outpatient Prescriptions on File Prior to Visit  Medication Sig Dispense Refill  . minocycline (MINOCIN,DYNACIN) 100 MG capsule Take 100 mg by mouth 2 (two) times daily.      . rosuvastatin (CRESTOR) 10 MG tablet 1 tab by mouth every other day  45 tablet  3   No current facility-administered medications on file prior to visit.   Review of Systems Constitutional: Negative for diaphoresis, activity change, appetite change or unexpected weight change.  HENT: Negative for hearing loss, ear pain, facial swelling, mouth sores and neck stiffness.   Eyes: Negative for pain, redness and visual disturbance.  Respiratory: Negative for shortness of breath and wheezing.   Cardiovascular: Negative for chest pain and palpitations.  Gastrointestinal: Negative for diarrhea, blood in stool, abdominal distention or other pain Genitourinary: Negative for hematuria, flank pain or change in urine volume.  Musculoskeletal: Negative for myalgias and joint swelling.  Skin: Negative for color change and  wound.  Neurological: Negative for syncope and numbness. other than noted Hematological: Negative for adenopathy.  Psychiatric/Behavioral: Negative for hallucinations, self-injury, decreased concentration and agitation.      Objective:   Physical Exam BP 110/72  Pulse 80  Temp(Src) 98.1 F (36.7 C) (Oral)  Ht 5\' 9"  (1.753 m)  Wt 220 lb 8 oz (100.018 kg)  BMI 32.55 kg/m2  SpO2 97% VS noted,  Constitutional: Pt is oriented to person, place, and time. Appears well-developed  and well-nourished.  Head: Normocephalic and atraumatic.  Right Ear: External ear normal.  Left Ear: External ear normal.  Nose: Nose normal.  Mouth/Throat: Oropharynx is clear and moist.  Bilat tm's with mild erythema.  Max sinus areas mild tender.  Pharynx with no erythema, no exudate Eyes: Conjunctivae and EOM are normal. Pupils are equal, round, and reactive to light.  Neck: Normal range of motion. Neck supple. No JVD present. No tracheal deviation present.  Cardiovascular: Normal rate, regular rhythm, normal heart sounds and intact distal pulses.   Pulmonary/Chest: Effort normal and breath sounds normal.  Abdominal: Soft. Bowel sounds are normal. There is no tenderness. No HSM  Musculoskeletal: Normal range of motion. Exhibits no edema.  Lymphadenopathy:  Has no cervical adenopathy.  Neurological: Pt is alert and oriented to person, place, and time. Pt has normal reflexes. No cranial nerve deficit. Motor 5/5, dtr/gait intact Skin: Skin is warm and dry. No rash noted.  Psychiatric:  Has  normal mood and affect. Behavior is normal.  Spine nontender, but has left lateral lumbar spasm/tender    Assessment & Plan:

## 2012-12-15 NOTE — Assessment & Plan Note (Signed)

## 2012-12-15 NOTE — Assessment & Plan Note (Signed)
Mild to mod, for antibx course,  to f/u any worsening symptoms or concerns 

## 2012-12-16 ENCOUNTER — Encounter: Payer: Self-pay | Admitting: Internal Medicine

## 2012-12-16 LAB — HEPATIC FUNCTION PANEL
Bilirubin, Direct: 0.1 mg/dL (ref 0.0–0.3)
Total Bilirubin: 0.6 mg/dL (ref 0.3–1.2)

## 2012-12-16 LAB — PSA: PSA: 0.24 ng/mL (ref 0.10–4.00)

## 2012-12-16 LAB — BASIC METABOLIC PANEL
Calcium: 10 mg/dL (ref 8.4–10.5)
Creatinine, Ser: 1 mg/dL (ref 0.4–1.5)
GFR: 84.53 mL/min (ref >60.00)

## 2012-12-16 LAB — LIPID PANEL
Cholesterol: 234 mg/dL — ABNORMAL HIGH (ref 0–200)
Total CHOL/HDL Ratio: 7
Triglycerides: 491 mg/dL — ABNORMAL HIGH (ref 0.0–149.0)
VLDL: 98.2 mg/dL — ABNORMAL HIGH (ref 0.0–40.0)

## 2012-12-16 LAB — LDL CHOLESTEROL, DIRECT: Direct LDL: 133.8 mg/dL

## 2013-01-12 ENCOUNTER — Encounter: Payer: Self-pay | Admitting: Internal Medicine

## 2013-01-12 ENCOUNTER — Ambulatory Visit (INDEPENDENT_AMBULATORY_CARE_PROVIDER_SITE_OTHER): Payer: BC Managed Care – PPO | Admitting: Internal Medicine

## 2013-01-12 VITALS — BP 108/62 | HR 75 | Temp 98.6°F | Ht 69.0 in | Wt 218.4 lb

## 2013-01-12 DIAGNOSIS — Z23 Encounter for immunization: Secondary | ICD-10-CM

## 2013-01-12 DIAGNOSIS — Z111 Encounter for screening for respiratory tuberculosis: Secondary | ICD-10-CM

## 2013-01-12 DIAGNOSIS — Z0000 Encounter for general adult medical examination without abnormal findings: Secondary | ICD-10-CM

## 2013-01-12 DIAGNOSIS — E785 Hyperlipidemia, unspecified: Secondary | ICD-10-CM

## 2013-01-12 NOTE — Patient Instructions (Signed)
You had the tetanus shot today (Tdap), and the TB skin test placed today Please make a Nurse Visit for Thursday or Friday later this week, so that your form can be completed Please re-start the crestor and take daily, or call if it becomes too expensive Please continue all other medications as before, and refills have been done if requested. Please have the pharmacy call with any other refills you may need.  Please continue your efforts at being more active, low cholesterol diet, and weight control. You are otherwise up to date with prevention measures today.  You are given the lab work results today  Please return in 1 year for your yearly visit, or sooner if needed, with Lab testing done 3-5 days before

## 2013-01-12 NOTE — Progress Notes (Signed)
Subjective:    Patient ID: Jimmy Baldwin, male    DOB: Sep 28, 1970, 42 y.o.   MRN: [id number]  HPI  Here for wellness and f/u;  Overall doing ok;  Pt denies CP, worsening SOB, DOE, wheezing, orthopnea, PND, worsening LE edema, palpitations, dizziness or syncope.  Pt denies neurological change such as new headache, facial or extremity weakness.  Pt denies polydipsia, polyuria, or low sugar symptoms. Pt states overall good compliance with treatment and medications, good tolerability, and has been trying to follow lower cholesterol diet.  Pt denies worsening depressive symptoms, suicidal ideation or panic. No fever, night sweats, wt loss, loss of appetite, or other constitutional symptoms.  Pt states good ability with ADL's, has low fall risk, home safety reviewed and adequate, no other significant changes in hearing or vision, and only occasionally active with exercise.  Crestor is expensive, but lipitor cassued myalgias.   Due for tetanus ahd TB skin test for employment appliication Past Medical History  Diagnosis Date  . Cervical disc disease     per MRI july 2011  . Hyperlipidemia 06/20/2011  . GERD (gastroesophageal reflux disease)     occ tums  . DJD (degenerative joint disease)   . PONV (postoperative nausea and vomiting)    Past Surgical History  Procedure Laterality Date  . Left shoulder surgury      x 3  . Right shoudler surgury      x 1  . Right knee surgury       x 4  - twice with ACL tear  . Knee arthroscopy      lt   . Carpal tunnel release  01/17/2012    Procedure: CARPAL TUNNEL RELEASE;  Surgeon: Nicki Reaper, MD;  Location: [facility];  Service: Orthopedics;  Laterality: Left;  left carpal tunnel release  . Carpal tunnel release  02/21/2012    Procedure: CARPAL TUNNEL RELEASE;  Surgeon: Nicki Reaper, MD;  Location: [facility];  Service: Orthopedics;  Laterality: Right;    reports that he quit smoking about 9 years ago. He has never used  smokeless tobacco. He reports that he does not drink alcohol or use illicit drugs. family history includes Diabetes in his other; Heart disease in his other; Hypertension in his other. No Known Allergies Current Outpatient Prescriptions on File Prior to Visit  Medication Sig Dispense Refill  . minocycline (MINOCIN,DYNACIN) 100 MG capsule Take 100 mg by mouth 2 (two) times daily.      . rosuvastatin (CRESTOR) 10 MG tablet 1 tab by mouth every other day  45 tablet  3   No current facility-administered medications on file prior to visit.    Review of Systems Constitutional: Negative for diaphoresis, activity change, appetite change or unexpected weight change.  HENT: Negative for hearing loss, ear pain, facial swelling, mouth sores and neck stiffness.   Eyes: Negative for pain, redness and visual disturbance.  Respiratory: Negative for shortness of breath and wheezing.   Cardiovascular: Negative for chest pain and palpitations.  Gastrointestinal: Negative for diarrhea, blood in stool, abdominal distention or other pain Genitourinary: Negative for hematuria, flank pain or change in urine volume.  Musculoskeletal: Negative for myalgias and joint swelling.  Skin: Negative for color change and wound.  Neurological: Negative for syncope and numbness. other than noted Hematological: Negative for adenopathy.  Psychiatric/Behavioral: Negative for hallucinations, self-injury, decreased concentration and agitation.      Objective:   Physical Exam BP 108/62  Pulse 75  Temp(Src) 98.6 F (37 C) (Oral)  Ht 5\' 9"  (1.753 m)  Wt 218 lb 6 oz (99.054 kg)  BMI 32.23 kg/m2  SpO2 97% VS noted,  Constitutional: Pt is oriented to person, place, and time. Appears well-developed and well-nourished.  Head: Normocephalic and atraumatic.  Right Ear: External ear normal.  Left Ear: External ear normal.  Nose: Nose normal.  Mouth/Throat: Oropharynx is clear and moist.  Eyes: Conjunctivae and EOM are normal.  Pupils are equal, round, and reactive to light.  Neck: Normal range of motion. Neck supple. No JVD present. No tracheal deviation present.  Cardiovascular: Normal rate, regular rhythm, normal heart sounds and intact distal pulses.   Pulmonary/Chest: Effort normal and breath sounds normal.  Abdominal: Soft. Bowel sounds are normal. There is no tenderness. No HSM  Musculoskeletal: Normal range of motion. Exhibits no edema.  Lymphadenopathy:  Has no cervical adenopathy.  Neurological: Pt is alert and oriented to person, place, and time. Pt has normal reflexes. No cranial nerve deficit.  Skin: Skin is warm and dry. No rash noted.  Psychiatric:  Has  normal mood and affect. Behavior is normal.      Assessment & Plan:

## 2013-01-12 NOTE — Assessment & Plan Note (Signed)

## 2013-01-12 NOTE — Assessment & Plan Note (Signed)
To re-start crestor,  to f/u any worsening symptoms or concerns

## 2013-03-19 ENCOUNTER — Observation Stay (HOSPITAL_COMMUNITY)
Admission: AD | Admit: 2013-03-19 | Discharge: 2013-03-20 | DRG: 287 | Disposition: A | Discharge status: Home / Self Care | Payer: BC Managed Care – PPO | Source: Ambulatory Visit | Attending: Cardiology | Admitting: Cardiology

## 2013-03-19 ENCOUNTER — Encounter: Payer: Self-pay | Admitting: *Deleted

## 2013-03-19 ENCOUNTER — Encounter: Payer: Self-pay | Admitting: Cardiology

## 2013-03-19 ENCOUNTER — Ambulatory Visit (HOSPITAL_COMMUNITY)
Admission: RE | Admit: 2013-03-19 | Discharge: 2013-03-19 | Disposition: A | Discharge status: Home / Self Care | Payer: BC Managed Care – PPO | Source: Ambulatory Visit | Attending: Cardiology | Admitting: Cardiology

## 2013-03-19 ENCOUNTER — Encounter (HOSPITAL_COMMUNITY)
Admission: AD | Disposition: A | Discharge status: Home / Self Care | Payer: Self-pay | Source: Ambulatory Visit | Attending: Cardiology

## 2013-03-19 ENCOUNTER — Encounter (HOSPITAL_COMMUNITY): Payer: Self-pay | Admitting: *Deleted

## 2013-03-19 ENCOUNTER — Ambulatory Visit (INDEPENDENT_AMBULATORY_CARE_PROVIDER_SITE_OTHER): Payer: BC Managed Care – PPO | Admitting: Cardiology

## 2013-03-19 ENCOUNTER — Encounter (HOSPITAL_COMMUNITY): Payer: Self-pay | Admitting: Pharmacist

## 2013-03-19 VITALS — BP 100/60 | HR 75 | Ht 69.0 in | Wt 216.0 lb

## 2013-03-19 DIAGNOSIS — E785 Hyperlipidemia, unspecified: Secondary | ICD-10-CM

## 2013-03-19 DIAGNOSIS — I200 Unstable angina: Secondary | ICD-10-CM

## 2013-03-19 DIAGNOSIS — Z833 Family history of diabetes mellitus: Secondary | ICD-10-CM

## 2013-03-19 DIAGNOSIS — I208 Other forms of angina pectoris: Secondary | ICD-10-CM

## 2013-03-19 DIAGNOSIS — I480 Paroxysmal atrial fibrillation: Secondary | ICD-10-CM

## 2013-03-19 DIAGNOSIS — M5090 Cervical disc disorder, unspecified, unspecified cervical region: Secondary | ICD-10-CM | POA: Diagnosis present

## 2013-03-19 DIAGNOSIS — Z8249 Family history of ischemic heart disease and other diseases of the circulatory system: Secondary | ICD-10-CM

## 2013-03-19 DIAGNOSIS — Z87891 Personal history of nicotine dependence: Secondary | ICD-10-CM

## 2013-03-19 DIAGNOSIS — Z7982 Long term (current) use of aspirin: Secondary | ICD-10-CM

## 2013-03-19 DIAGNOSIS — K219 Gastro-esophageal reflux disease without esophagitis: Secondary | ICD-10-CM | POA: Diagnosis present

## 2013-03-19 DIAGNOSIS — I4891 Unspecified atrial fibrillation: Principal | ICD-10-CM | POA: Diagnosis present

## 2013-03-19 DIAGNOSIS — Z79899 Other long term (current) drug therapy: Secondary | ICD-10-CM

## 2013-03-19 DIAGNOSIS — I209 Angina pectoris, unspecified: Secondary | ICD-10-CM

## 2013-03-19 DIAGNOSIS — R079 Chest pain, unspecified: Secondary | ICD-10-CM

## 2013-03-19 DIAGNOSIS — I2089 Other forms of angina pectoris: Secondary | ICD-10-CM

## 2013-03-19 DIAGNOSIS — M5030 Other cervical disc degeneration, unspecified cervical region: Secondary | ICD-10-CM | POA: Diagnosis present

## 2013-03-19 HISTORY — PX: LEFT HEART CATHETERIZATION WITH CORONARY ANGIOGRAM: SHX5451

## 2013-03-19 LAB — MAGNESIUM: Magnesium: 2.2 mg/dL (ref 1.5–2.5)

## 2013-03-19 LAB — CBC WITH DIFFERENTIAL/PLATELET
Basophils Absolute: 0 10*3/uL (ref 0.0–0.1)
HCT: 42.5 % (ref 39.0–52.0)
Hemoglobin: 15.3 g/dL (ref 13.0–17.0)
Lymphocytes Relative: 25 % (ref 12–46)
Lymphs Abs: 1.8 10*3/uL (ref 0.7–4.0)
Monocytes Absolute: 0.7 10*3/uL (ref 0.1–1.0)
Monocytes Relative: 10 % (ref 3–12)
Neutro Abs: 4.3 10*3/uL (ref 1.7–7.7)
Neutrophils Relative %: 62 % (ref 43–77)
RBC: 4.68 MIL/uL (ref 4.22–5.81)
WBC: 7 10*3/uL (ref 4.0–10.5)

## 2013-03-19 LAB — COMPREHENSIVE METABOLIC PANEL
AST: 29 U/L (ref 0–37)
Albumin: 4.6 g/dL (ref 3.5–5.2)
Alkaline Phosphatase: 89 U/L (ref 39–117)
BUN: 10 mg/dL (ref 6–23)
CO2: 26 mEq/L (ref 19–32)
Chloride: 101 mEq/L (ref 96–112)
GFR calc Af Amer: >90 mL/min (ref >90)
Potassium: 4.4 mEq/L (ref 3.5–5.1)
Total Bilirubin: 0.7 mg/dL (ref 0.3–1.2)

## 2013-03-19 LAB — APTT: aPTT: 176 seconds — ABNORMAL HIGH (ref 24–37)

## 2013-03-19 LAB — TSH: TSH: 3.81 u[IU]/mL (ref 0.350–4.500)

## 2013-03-19 LAB — MRSA PCR SCREENING: MRSA by PCR: NEGATIVE

## 2013-03-19 SURGERY — LEFT HEART CATHETERIZATION WITH CORONARY ANGIOGRAM
Anesthesia: LOCAL

## 2013-03-19 MED ORDER — ASPIRIN 81 MG PO CHEW
324.0000 mg | CHEWABLE_TABLET | ORAL | Status: AC
  Administered 2013-03-19: 324 mg via ORAL

## 2013-03-19 MED ORDER — HEPARIN (PORCINE) IN NACL 100-0.45 UNIT/ML-% IJ SOLN
1100.0000 [IU]/h | INTRAMUSCULAR | Status: DC
  Administered 2013-03-19: 1100 [IU]/h via INTRAVENOUS
  Filled 2013-03-19: qty 250

## 2013-03-19 MED ORDER — ASPIRIN EC 81 MG PO TBEC
81.0000 mg | DELAYED_RELEASE_TABLET | Freq: Every day | ORAL | Status: DC
  Filled 2013-03-19: qty 1

## 2013-03-19 MED ORDER — ONDANSETRON HCL 4 MG/2ML IJ SOLN: 4.0000 mg | Freq: Four times a day (QID) | INTRAMUSCULAR | Status: DC | PRN

## 2013-03-19 MED ORDER — SODIUM CHLORIDE 0.9 % IV SOLN: 250.0000 mL | INTRAVENOUS | Status: DC | PRN

## 2013-03-19 MED ORDER — SODIUM CHLORIDE 0.9 % IV SOLN
INTRAVENOUS | Status: DC
  Administered 2013-03-19: 13:00:00 via INTRAVENOUS

## 2013-03-19 MED ORDER — VERAPAMIL HCL 2.5 MG/ML IV SOLN
INTRAVENOUS | Status: AC
  Filled 2013-03-19: qty 2

## 2013-03-19 MED ORDER — SODIUM CHLORIDE 0.9 % IJ SOLN: 3.0000 mL | Freq: Two times a day (BID) | INTRAMUSCULAR | Status: DC

## 2013-03-19 MED ORDER — MIDAZOLAM HCL 2 MG/2ML IJ SOLN
INTRAMUSCULAR | Status: AC
  Filled 2013-03-19: qty 2

## 2013-03-19 MED ORDER — ASPIRIN 300 MG RE SUPP
300.0000 mg | RECTAL | Status: AC
  Filled 2013-03-19: qty 1

## 2013-03-19 MED ORDER — SODIUM CHLORIDE 0.9 % IJ SOLN: 3.0000 mL | INTRAMUSCULAR | Status: DC | PRN

## 2013-03-19 MED ORDER — ASPIRIN 81 MG PO CHEW
81.0000 mg | CHEWABLE_TABLET | ORAL | Status: DC
  Filled 2013-03-19: qty 1

## 2013-03-19 MED ORDER — LIDOCAINE HCL (PF) 1 % IJ SOLN
INTRAMUSCULAR | Status: AC
  Filled 2013-03-19: qty 30

## 2013-03-19 MED ORDER — ATORVASTATIN CALCIUM 20 MG PO TABS
20.0000 mg | ORAL_TABLET | Freq: Every day | ORAL | Status: DC
  Administered 2013-03-19: 20 mg via ORAL
  Filled 2013-03-19 (×2): qty 1

## 2013-03-19 MED ORDER — SODIUM CHLORIDE 0.9 % IV SOLN: INTRAVENOUS | Status: AC

## 2013-03-19 MED ORDER — NITROGLYCERIN 0.2 MG/ML ON CALL CATH LAB
INTRAVENOUS | Status: AC
  Filled 2013-03-19: qty 1

## 2013-03-19 MED ORDER — NITROGLYCERIN 2 % TD OINT
0.5000 [in_us] | TOPICAL_OINTMENT | Freq: Four times a day (QID) | TRANSDERMAL | Status: DC
  Administered 2013-03-19: 0.5 [in_us] via TOPICAL
  Filled 2013-03-19: qty 30

## 2013-03-19 MED ORDER — SODIUM CHLORIDE 0.9 % IJ SOLN
3.0000 mL | Freq: Two times a day (BID) | INTRAMUSCULAR | Status: DC
  Administered 2013-03-19: 3 mL via INTRAVENOUS

## 2013-03-19 MED ORDER — DIAZEPAM 5 MG PO TABS
5.0000 mg | ORAL_TABLET | ORAL | Status: AC
  Administered 2013-03-19: 5 mg via ORAL
  Filled 2013-03-19: qty 1

## 2013-03-19 MED ORDER — ACETAMINOPHEN 325 MG PO TABS: 650.0000 mg | ORAL_TABLET | ORAL | Status: DC | PRN

## 2013-03-19 MED ORDER — ZOLPIDEM TARTRATE 5 MG PO TABS: 5.0000 mg | ORAL_TABLET | Freq: Every evening | ORAL | Status: DC | PRN

## 2013-03-19 MED ORDER — MORPHINE SULFATE 2 MG/ML IJ SOLN
2.0000 mg | INTRAMUSCULAR | Status: DC | PRN
  Administered 2013-03-19 (×2): 2 mg via INTRAVENOUS
  Filled 2013-03-19 (×2): qty 1

## 2013-03-19 MED ORDER — FENTANYL CITRATE 0.05 MG/ML IJ SOLN
INTRAMUSCULAR | Status: AC
  Filled 2013-03-19: qty 2

## 2013-03-19 MED ORDER — ACETAMINOPHEN 325 MG PO TABS
650.0000 mg | ORAL_TABLET | ORAL | Status: DC | PRN
  Administered 2013-03-19: 650 mg via ORAL
  Filled 2013-03-19: qty 2

## 2013-03-19 MED ORDER — MINOCYCLINE HCL 100 MG PO CAPS
100.0000 mg | ORAL_CAPSULE | Freq: Two times a day (BID) | ORAL | Status: DC
  Filled 2013-03-19 (×3): qty 1

## 2013-03-19 MED ORDER — HEPARIN BOLUS VIA INFUSION
4000.0000 [IU] | Freq: Once | INTRAVENOUS | Status: AC
  Administered 2013-03-19: 4000 [IU] via INTRAVENOUS
  Filled 2013-03-19: qty 4000

## 2013-03-19 MED ORDER — HEPARIN (PORCINE) IN NACL 2-0.9 UNIT/ML-% IJ SOLN
INTRAMUSCULAR | Status: AC
  Filled 2013-03-19: qty 1000

## 2013-03-19 MED ORDER — FLECAINIDE ACETATE 50 MG PO TABS
50.0000 mg | ORAL_TABLET | Freq: Two times a day (BID) | ORAL | Status: DC
  Administered 2013-03-19 – 2013-03-20 (×2): 50 mg via ORAL
  Filled 2013-03-19 (×4): qty 1

## 2013-03-19 MED ORDER — HEPARIN SODIUM (PORCINE) 1000 UNIT/ML IJ SOLN
INTRAMUSCULAR | Status: AC
  Filled 2013-03-19: qty 1

## 2013-03-19 MED ORDER — METOPROLOL TARTRATE 12.5 MG HALF TABLET
12.5000 mg | ORAL_TABLET | Freq: Two times a day (BID) | ORAL | Status: DC
  Administered 2013-03-19 (×2): 12.5 mg via ORAL
  Filled 2013-03-19 (×4): qty 1

## 2013-03-19 MED ORDER — NITROGLYCERIN 0.4 MG SL SUBL: 0.4000 mg | SUBLINGUAL_TABLET | SUBLINGUAL | Status: DC | PRN

## 2013-03-19 MED ORDER — ALPRAZOLAM 0.25 MG PO TABS: 0.2500 mg | ORAL_TABLET | Freq: Two times a day (BID) | ORAL | Status: DC | PRN

## 2013-03-19 NOTE — Progress Notes (Signed)
Refused cardiac cath video

## 2013-03-19 NOTE — Progress Notes (Signed)
ANTICOAGULATION CONSULT NOTE - Initial Consult  Pharmacy Consult for Heparin Indication: chest pain/ACS   No Known Allergies  Patient Measurements:   Heparin Dosing Weight: 90.65 kg   Vital Signs: BP: 100/60 mmHg (10/31 1023) Pulse Rate: 75 (10/31 1023)  Labs: No results found for this basename: HGB, HCT, PLT, APTT, LABPROT, INR, HEPARINUNFRC, CREATININE, CKTOTAL, CKMB, TROPONINI,  in the last 72 hours  The CrCl is unknown because both a height and weight (above a minimum accepted value) are required for this calculation.   Medical History: Past Medical History  Diagnosis Date  . Cervical disc disease     per MRI july 2011  . Hyperlipidemia 06/20/2011  . GERD (gastroesophageal reflux disease)     occ tums  . DJD (degenerative joint disease)   . PONV (postoperative nausea and vomiting)   . History of stress test 03-07-2011    EF 56% Normal pattern of perfusion in all regions, low risk scan compared to previous study    Medications:  Prescriptions prior to admission  Medication Sig Dispense Refill  . ibuprofen (ADVIL,MOTRIN) 200 MG tablet Take 200 mg by mouth every 6 (six) hours as needed for pain.      . minocycline (MINOCIN,DYNACIN) 100 MG capsule Take 100 mg by mouth 2 (two) times daily.      . rosuvastatin (CRESTOR) 10 MG tablet 1 tab by mouth every other day  45 tablet  3    Assessment: 42 y.o.m presents with 1 week hx of episodic rapid heart rate and CP. Pharmacy consulted to start heparin for ACS/chest pain   Goal of Therapy:  Heparin level 0.3-0.7 units/ml Monitor platelets by anticoagulation protocol: Yes   Plan:  Give 4000 units bolus x 1 Start heparin infusion at 1100 units/hr Check anti-Xa level in 6 hours and daily while on heparin Continue to monitor H&H and platelets  Vinnie Level, PharmD.  [place] License #[id number] Application for [place] reciprocity pending  Clinical Pharmacist Pager [phone]

## 2013-03-19 NOTE — H&P (Signed)
Jimmy Baldwin is an 42 y.o. male.    Primary Cardiologist:Dr. 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  At times chest pain alone.  1 st episode he was at his desk developed a fullness in throat, then burning and pressure in his chest and rapid heart rate.  He had a Latte that am and thought that was cause, but he also thought he was having a heart attack.  Slowly his heart rate slowed  And he felt better.  The pai though has been mildly present since that time.  He has 4 episodes of significant pain with fast heart rate.  Last night was another bad episode.  Initial EKG SR without changes, ( previous cath in 2006 with normal coronary arteries).   Then after exam pt complained of increasing chest pain and fast heart rate I could hear brief episodes of tachycardia, we repeated EKG and now episode of PAF, RVR that he is symptomatic with.      Discussed with Dr. Allyson Sabal and plan for admit to rule out ischemic cause with cath.   Past Medical History  Diagnosis Date  . Cervical disc disease     per MRI july 2011  . Hyperlipidemia 06/20/2011  . GERD (gastroesophageal reflux disease)     occ tums  . DJD (degenerative joint disease)   . PONV (postoperative nausea and vomiting)   . History of stress test 03-07-2011    EF 56% Normal pattern of perfusion in all regions, low risk scan compared to previous study    Past Surgical History  Procedure Laterality Date  . Left shoulder surgury      x 3  . Right shoudler surgury      x 1  . Right knee surgury       x 4  - twice with ACL tear  . Knee arthroscopy      lt   . Carpal tunnel release  01/17/2012    Procedure: CARPAL TUNNEL RELEASE;  Surgeon: Nicki Reaper, MD;  Location: [facility];  Service: Orthopedics;   Laterality: Left;  left carpal tunnel release  . Carpal tunnel release  02/21/2012    Procedure: CARPAL TUNNEL RELEASE;  Surgeon: Nicki Reaper, MD;  Location: [facility];  Service: Orthopedics;  Laterality: Right;  . Cardiac catheterization  06-01-2004    normal cath; chest pain considered to be noncardiac    Family History  Problem Relation Age of Onset  . Heart disease Other   . Hypertension Other   . Diabetes Other   . Heart disease Mother   . Heart attack Father 16    cabg  . Healthy Sister   . Healthy Sister    Social History:  reports that he quit smoking about 10 years ago. He has never used smokeless tobacco. He reports that he does not drink alcohol or use illicit drugs.  Allergies: No Known Allergies  No prescriptions prior to admission    No results found for this or any previous visit (from the past 48 hour(s)). No results found.  ROS: General:recent cold but no "D" meds, no weight changes Skin:no rashes or ulcers HEENT:no blurred vision, no congestion CV:see HPI PUL:see HPI GI:no  diarrhea constipation or melena, no indigestion GU:no hematuria, no dysuria MS:no joint pain, no claudication Neuro:no syncope, no lightheadedness Endo:no diabetes, no thyroid disease    BP 100/60  P 75   PE: General:Pleasant affect, NAD Skin:Warm and dry, brisk capillary refill HEENT:normocephalic, sclera clear, mucus membranes moist Neck:supple, no JVD, no bruits  Heart:S1S2 RRR without murmur, gallup, rub or click, then episode of tachycardia Lungs:clear without rales, rhonchi, or wheezes AVW:UJWJ, non tender, + BS, do not palpate liver spleen or masses Ext:no lower ext edema, 2+ pedal pulses, 2+ radial pulses Neuro:alert and oriented, MAE, follows commands, + facial symmetry    Assessment/Plan Principal Problem:   Angina at rest, secondary to ischemia VS. PAF Active Problems:   Hyperlipidemia   PAF (paroxysmal atrial fibrillation),  symptomatic  PLAN:admit to [facility], we will add IV Heparin for now.  I have added Lopressor and will continue statin.  Plan for cath today.  Ate BK at 0800.    [facility] Nurse Practitioner Certified [facility] Pager [phone] 03/19/2013, 11:33 AM    Agree with note written by Nada Boozer RNP  Pt well known to me. I And sisters ago and he had normal coronary arteries. He does have a family history of heart disease. He has had chest pain for the last week with PAF with RVR. His exam today is benign. I am admitting him to the hospital for cardiac catheterization to be performed by Dr. Bryan Lemma.  Runell Gess 03/19/2013 12:58 PM

## 2013-03-19 NOTE — Assessment & Plan Note (Signed)
Recently resumed crestor

## 2013-03-19 NOTE — CV Procedure (Signed)
CARDIAC CATHETERIZATION INTERVENTION REPORT  NAME:  Jimmy Baldwin   MRN: [id number] DOB:  25-Nov-1970   ADMIT DATE: 03/19/2013 Procedure Date: 03/19/2013  INTERVENTIONAL CARDIOLOGIST: Marykay Lex, M.D., MS PRIMARY CARE PROVIDER: Oliver Barre, MD PRIMARY CARDIOLOGIST: Runell Gess, M.D.  PATIENT:  Jimmy Baldwin is a 42 y.o. male who is on a long-term patient of Dr. 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 The patient himself has been relatively free of significant disease other than maybe dyslipidemia. He has noted progressive symptoms of chest discomfort at rest associated with rapid heart rates. He discussed the most recent episode as being a sense of fullness and burning pressure in his chest with rapid heartbeat. As the most severe episode however he has had several other episodes. Including but night prior to arrival. When it reoccurred in the morning, he decided to go to see Dr. Allyson Sabal. While in the clinic seeing Nada Boozer, NP he had another episode and was found to be in atrial fibrillation with rapid ventricular rate. He is now referred for observation overnight with cardiac catheterization to exclude an ischemic etiology for his atrial fibrillation given the extent of his chest pain/angina.  PRE-OPERATIVE DIAGNOSIS:    Unstable angina  New onset A. fib with RVR  PROCEDURES PERFORMED:    Left Heart Catheterization with Coronary Angiography  PROCEDURE:Consent:  Risks of procedure as well as the alternatives and risks of each were explained to the (patient/caregiver).  Consent for procedure obtained. Consent for signed by MD and patient with RN witness -- placed on chart.   PROCEDURE: The patient was brought to the 2nd Floor [facility] in the fasting state and prepped and draped in the usual sterile fashion for Right groin or radial access. A modified Allen's test with plethysmography was performed, revealing excellent Ulnar artery  collateral flow.  Sterile technique was used including antiseptics, cap, gloves, gown, hand hygiene, mask and sheet.  Skin prep: Chlorhexidine.  Time Out: Verified patient identification, verified procedure, site/side was marked, verified correct patient position, special equipment/implants available, medications/allergies/relevent history reviewed, required imaging and test results available.  Performed  Access: Right Radial Artery; 5 Fr Sheath -- Seldinger technique (Angiocath Micropuncture Kit)  Radial Cocktail (via sheath-10 mL), IV Heparin 5000 Units Diagnostic:  5 French TIG 4.0, JR 4, Angled Pigtail catheters advanced and exchanged over long exchange safety J-wire  Left Coronary Artery Angiography: TIG 4.0  Right Coronary Artery Angiography: JR 4  LV Hemodynamics (LV Gram): Angled pigtail (the valve was initially crossed using the JR 4 catheter exchanged with a wire in the ventricle for the angled pigtail)  TR Band:  1550 Hours, 12 mL air  MEDICATIONS:  Anesthesia:  Local Lidocaine 2 ml  Sedation:  3 mg IV Versed, 75 mcg IV fentanyl ;   Premedication: 5 mg oral Valium  Omnipaque Contrast: 80 ml  Anticoagulation:  IV Heparin 5000 Units ;  Radial Cocktail: 5 mg Verapamil, 400 mcg NTG, 2 ml 2% Lidocaine in 10 ml NS  Hemodynamics:  Central Aortic / Mean Pressures: 109/90 mmHg; 97 mmHg  Left Ventricular Pressures / EDP: 109/11 mmHg; 13 mmHg  Left Ventriculography:  EF: 60-65 %  Wall Motion: Normal  Coronary Anatomy:  Left Main: Large caliber vessel it bifurcates distally into the LAD, and Circumflex. Angiographically normal LAD: Large-caliber vessel that wraps around the apex giving rise to 3 small caliber diagonal branches. No significant lesions in the entire LAD system.  Left Circumflex:  Large-caliber vessel that gives a small marginal branch that bifurcates in the twin very small vessels before coursing into the AV groove where it gives off one moderate to large  caliber and the second moderate caliber obtuse marginal branch.  OM1: This is a major marginal branch which courses all the way to the apex. Angiographically normal  OM 2: Smaller branch that courses along the inferolateral wall. Angiographically normal.   RCA: Large caliber, dominant vessel that has a downward takeoff. There several small proximal branches including the SA nodal branch and an atrial branch. The vessel bifurcates distally into the relatively small caliber RPDA and Right Posterior AV Groove Branch (RPAV).  Angiographically normal  RPDA: Small-caliber vessel which reaches about two thirds the way to the apex.. Angiographically normal  RPL Sysytem:The RPAV smaller moderate caliber vessel gives rise to 3 small RPL branches. Angiographically normal  PATIENT DISPOSITION:    The patient was transferred to the PACU holding area in a hemodynamicaly stable, chest pain free condition.  The patient tolerated the procedure well, and there were no complications.  EBL:   < 5 ml  The patient was stable before, during, and after the procedure.  POST-OPERATIVE DIAGNOSIS:    Angiographically normal coronary arteries with normal LV EF and EDP. This would suggest that the patient's symptoms are simply related to A. fib with RVR  PLAN OF CARE:  Observe overnight for any further episodes of A. fib. Standard post radial cath care.  Initiate Flecainide for antiarrhythmic effect in addition to the low-dose beta blocker was added on admission.   Marykay Lex, M.D., M.S. [facility] [address] [place], [place]  [address]  [phone]  03/19/2013 4:07 PM

## 2013-03-19 NOTE — Progress Notes (Signed)
Pt with angina, either due to PAF or ischemia.  Will admit to [facility] and plan cardiac cath today see H&P for hospital.

## 2013-03-19 NOTE — Interval H&P Note (Signed)
History and Physical Interval Note:  03/19/2013 1:15 PM  Jimmy Baldwin  has presented today for surgery, with the diagnosis of Resting Chest Pain with Afib RVR -- concerning for Unstable Angina.   The various methods of treatment have been discussed with the patient and family. After consideration of risks, benefits and other options for treatment, the patient has consented to  Procedure(s): LEFT HEART CATHETERIZATION WITH CORONARY ANGIOGRAM (N/A) +/- PCI as a surgical intervention .  The patient's history has been reviewed, patient examined, no change in status, stable for surgery.  I have reviewed the patient's chart and labs.  Questions were answered to the patient's satisfaction.    Principal Problem:   Unstable angina - in setting of Afib RVR (Newly diagnosed) Active Problems:   PAF (paroxysmal atrial fibrillation), symptomatic   Hyperlipidemia   Angina at rest, secondary to ischemia VS. PAF    HARDING,DAVID W  Cath Lab Visit (complete for each Cath Lab visit)  Clinical Evaluation Leading to the Procedure:   ACS: yes - Unstable Angina  Non-ACS:    Anginal Classification: CCS III  Anti-ischemic medical therapy: Minimal Therapy (1 class of medications)  Non-Invasive Test Results: No non-invasive testing performed  Prior CABG: No previous CABG

## 2013-03-19 NOTE — Progress Notes (Signed)
Agree with the above assessment and plan. Follow heparin level this evening if no cath is planned.  Sheppard Coil PharmD., BCPS Clinical Pharmacist Pager [phone] 03/19/2013 12:00 PM

## 2013-03-19 NOTE — Patient Instructions (Signed)
Go to admit department at [facility] to be admitted.  Will plan for cath today. Do Not eat or drink.

## 2013-03-19 NOTE — Progress Notes (Signed)
Back from the cath lab by awake and alert. TR band to right right wrist intact. Elevated with pillow. Pulse ox to right thumb applied. Instructed not to move right arm.

## 2013-03-19 NOTE — Progress Notes (Signed)
To the cath lab by bed, heparin stopped. 

## 2013-03-19 NOTE — Care Management Note (Signed)
    Page 1 of 1   03/19/2013     12:05:21 PM   CARE MANAGEMENT NOTE 03/19/2013  Patient:  Jimmy Baldwin,Jimmy Baldwin   Account Number:  [id number]  Date Initiated:  03/19/2013  Documentation initiated by:  Junius Creamer  Subjective/Objective Assessment:   adm w angina, at fib w rvr symptomatic     Action/Plan:   lives w wife, pc dr Darryll Capers   Anticipated DC Date:     Anticipated DC Plan:           Choice offered to / List presented to:             Status of service:   Medicare Important Message given?   (If response is "NO", the following Medicare IM given date fields will be blank) Date Medicare IM given:   Date Additional Medicare IM given:    Discharge Disposition:  HOME/SELF CARE  Per UR Regulation:  Reviewed for med. necessity/level of care/duration of stay  If discussed at Long Length of Stay Meetings, dates discussed:    Comments:

## 2013-03-19 NOTE — Progress Notes (Signed)
TR band removed,  2x2 gauze and tegaderm applied. Denied any discomfort.

## 2013-03-20 DIAGNOSIS — R079 Chest pain, unspecified: Secondary | ICD-10-CM

## 2013-03-20 DIAGNOSIS — Z8249 Family history of ischemic heart disease and other diseases of the circulatory system: Secondary | ICD-10-CM

## 2013-03-20 LAB — LIPID PANEL
Cholesterol: 201 mg/dL — ABNORMAL HIGH (ref 0–200)
HDL: 28 mg/dL — ABNORMAL LOW (ref >39)
LDL Cholesterol: 128 mg/dL — ABNORMAL HIGH (ref 0–99)
Total CHOL/HDL Ratio: 7.2 RATIO
Triglycerides: 227 mg/dL — ABNORMAL HIGH (ref <150)
VLDL: 45 mg/dL — ABNORMAL HIGH (ref 0–40)

## 2013-03-20 LAB — BASIC METABOLIC PANEL
CO2: 26 mEq/L (ref 19–32)
Calcium: 9.1 mg/dL (ref 8.4–10.5)
Creatinine, Ser: 0.91 mg/dL (ref 0.50–1.35)
GFR calc non Af Amer: >90 mL/min (ref >90)
Potassium: 4.2 mEq/L (ref 3.5–5.1)
Sodium: 142 mEq/L (ref 135–145)

## 2013-03-20 LAB — CBC
Hemoglobin: 14 g/dL (ref 13.0–17.0)
MCH: 32.9 pg (ref 26.0–34.0)
MCHC: 34.7 g/dL (ref 30.0–36.0)
Platelets: 181 10*3/uL (ref 150–400)
RDW: 12 % (ref 11.5–15.5)

## 2013-03-20 MED ORDER — FLECAINIDE ACETATE 50 MG PO TABS: 50.0000 mg | ORAL_TABLET | Freq: Two times a day (BID) | ORAL | Status: DC

## 2013-03-20 MED ORDER — ACETAMINOPHEN 325 MG PO TABS: 650.0000 mg | ORAL_TABLET | ORAL | Status: DC | PRN

## 2013-03-20 MED ORDER — METOPROLOL TARTRATE 12.5 MG HALF TABLET: 12.5000 mg | ORAL_TABLET | Freq: Two times a day (BID) | ORAL | Status: DC

## 2013-03-20 MED ORDER — ASPIRIN 81 MG PO TBEC: 81.0000 mg | DELAYED_RELEASE_TABLET | Freq: Every day | ORAL | Status: DC

## 2013-03-20 NOTE — Progress Notes (Signed)
DAILY PROGRESS NOTE  Subjective:  No events overnight.  Maintaining sinus rhythm. Feels well today. Has had 2 doses of flecainide. QTC is 429 msec today. Cath yesterday showed normal coronaries.  Objective:  Temp:  [97.5 F (36.4 C)-98.8 F (37.1 C)] 97.5 F (36.4 C) (11/01 0800) Pulse Rate:  [71-99] 81 (10/31 2200) Resp:  [12-25] 13 (11/01 0400) BP: (90-155)/(53-102) 119/74 mmHg (11/01 0800) SpO2:  [91 %-100 %] 91 % (11/01 0355) Weight:  [211 lb 10.3 oz (96 kg)-216 lb (97.977 kg)] 211 lb 10.3 oz (96 kg) (10/31 1206) Weight change:   Intake/Output from previous day: 10/31 0701 - 11/01 0700 In: 1201 [P.O.:440; I.V.:761] Out: 800 [Urine:800]  Intake/Output from this shift:    Medications: Current Facility-Administered Medications  Medication Dose Route Frequency Provider Last Rate Last Dose  . 0.9 %  sodium chloride infusion   Intravenous Continuous Nada Boozer, NP 75 mL/hr at 03/19/13 1230    . 0.9 %  sodium chloride infusion  250 mL Intravenous PRN Marykay Lex, MD      . acetaminophen (TYLENOL) tablet 650 mg  650 mg Oral Q4H PRN Marykay Lex, MD   650 mg at 03/19/13 2225  . ALPRAZolam Prudy Feeler) tablet 0.25 mg  0.25 mg Oral BID PRN Nada Boozer, NP      . aspirin EC tablet 81 mg  81 mg Oral Daily Nada Boozer, NP      . atorvastatin (LIPITOR) tablet 20 mg  20 mg Oral q1800 Nada Boozer, NP   20 mg at 03/19/13 1722  . flecainide (TAMBOCOR) tablet 50 mg  50 mg Oral Q12H Marykay Lex, MD   50 mg at 03/20/13 0810  . metoprolol tartrate (LOPRESSOR) tablet 12.5 mg  12.5 mg Oral BID Nada Boozer, NP   12.5 mg at 03/19/13 2225  . minocycline (MINOCIN,DYNACIN) capsule 100 mg  100 mg Oral BID Nada Boozer, NP      . morphine 2 MG/ML injection 2 mg  2 mg Intravenous Q1H PRN Marykay Lex, MD   2 mg at 03/19/13 2017  . nitroGLYCERIN (NITROGLYN) 2 % ointment 0.5 inch  0.5 inch Topical Q6H Nada Boozer, NP   0.5 inch at 03/19/13 1415  . nitroGLYCERIN (NITROSTAT) SL tablet  0.4 mg  0.4 mg Sublingual Q5 Min x 3 PRN Nada Boozer, NP      . ondansetron [facility]) injection 4 mg  4 mg Intravenous Q6H PRN Nada Boozer, NP      . sodium chloride 0.9 % injection 3 mL  3 mL Intravenous Q12H Marykay Lex, MD      . sodium chloride 0.9 % injection 3 mL  3 mL Intravenous PRN Marykay Lex, MD      . zolpidem (AMBIEN) tablet 5 mg  5 mg Oral QHS PRN,MR X 1 Nada Boozer, NP        Physical Exam: General appearance: alert and no distress Neck: no carotid bruit and no JVD Lungs: clear to auscultation bilaterally Heart: regular rate and rhythm, S1, S2 normal, no murmur, click, rub or gallop Abdomen: soft, non-tender; bowel sounds normal; no masses,  no organomegaly Extremities: extremities normal, atraumatic, no cyanosis or edema and cath site without hematoma, good distal pulse Pulses: 2+ and symmetric Skin: Skin color, texture, turgor normal. No rashes or lesions Neurologic: Grossly normal Psych: Mood, affect normal  Lab Results: Results for orders placed during the hospital encounter of 03/19/13 (from the past 48 hour(s))  MRSA PCR  SCREENING     Status: None   Collection Time    03/19/13 12:21 PM      Result Value Range   MRSA by PCR NEGATIVE  NEGATIVE   Comment:            The GeneXpert MRSA Assay (FDA     approved for NASAL specimens     only), is one component of a     comprehensive MRSA colonization     surveillance program. It is not     intended to diagnose MRSA     infection nor to guide or     monitor treatment for     MRSA infections.  TROPONIN I     Status: None   Collection Time    03/19/13 12:55 PM      Result Value Range   Troponin I <0.30  <0.30 ng/mL   Comment:            Due to the release kinetics of cTnI,     a negative result within the first hours     of the onset of symptoms does not rule out     myocardial infarction with certainty.     If myocardial infarction is still suspected,     repeat the test at appropriate intervals.    PROTIME-INR     Status: None   Collection Time    03/19/13 12:55 PM      Result Value Range   Prothrombin Time 13.3  11.6 - 15.2 seconds   INR 1.03  0.00 - 1.49  APTT     Status: Abnormal   Collection Time    03/19/13 12:55 PM      Result Value Range   aPTT 176 (*) 24 - 37 seconds   Comment:            IF BASELINE aPTT IS ELEVATED,     SUGGEST PATIENT RISK ASSESSMENT     BE USED TO DETERMINE APPROPRIATE     ANTICOAGULANT THERAPY.  CBC WITH DIFFERENTIAL     Status: None   Collection Time    03/19/13 12:55 PM      Result Value Range   WBC 7.0  4.0 - 10.5 K/uL   RBC 4.68  4.22 - 5.81 MIL/uL   Hemoglobin 15.3  13.0 - 17.0 g/dL   HCT 40.9  81.1 - 91.4 %   MCV 90.8  78.0 - 100.0 fL   MCH 32.7  26.0 - 34.0 pg   MCHC 36.0  30.0 - 36.0 g/dL   RDW 78.2  95.6 - 21.3 %   Platelets 215  150 - 400 K/uL   Neutrophils Relative % 62  43 - 77 %   Neutro Abs 4.3  1.7 - 7.7 K/uL   Lymphocytes Relative 25  12 - 46 %   Lymphs Abs 1.8  0.7 - 4.0 K/uL   Monocytes Relative 10  3 - 12 %   Monocytes Absolute 0.7  0.1 - 1.0 K/uL   Eosinophils Relative 2  0 - 5 %   Eosinophils Absolute 0.2  0.0 - 0.7 K/uL   Basophils Relative 0  0 - 1 %   Basophils Absolute 0.0  0.0 - 0.1 K/uL  TSH     Status: None   Collection Time    03/19/13 12:55 PM      Result Value Range   TSH 3.810  0.350 - 4.500 uIU/mL   Comment: Performed at [facility]  COMPREHENSIVE METABOLIC PANEL     Status: None   Collection Time    03/19/13 12:55 PM      Result Value Range   Sodium 139  135 - 145 mEq/L   Potassium 4.4  3.5 - 5.1 mEq/L   Chloride 101  96 - 112 mEq/L   CO2 26  19 - 32 mEq/L   Glucose, Bld 82  70 - 99 mg/dL   BUN 10  6 - 23 mg/dL   Creatinine, Ser 1.61  0.50 - 1.35 mg/dL   Calcium 9.5  8.4 - 09.6 mg/dL   Total Protein 7.9  6.0 - 8.3 g/dL   Albumin 4.6  3.5 - 5.2 g/dL   AST 29  0 - 37 U/L   ALT 38  0 - 53 U/L   Alkaline Phosphatase 89  39 - 117 U/L   Total Bilirubin 0.7  0.3 - 1.2 mg/dL   GFR  calc non Af Amer >90  >90 mL/min   GFR calc Af Amer >90  >90 mL/min   Comment: (NOTE)     The eGFR has been calculated using the CKD EPI equation.     This calculation has not been validated in all clinical situations.     eGFR's persistently <90 mL/min signify possible Chronic Kidney     Disease.  MAGNESIUM     Status: None   Collection Time    03/19/13 12:55 PM      Result Value Range   Magnesium 2.2  1.5 - 2.5 mg/dL  PRO B NATRIURETIC PEPTIDE     Status: None   Collection Time    03/19/13 12:55 PM      Result Value Range   Pro B Natriuretic peptide (BNP) 8.6  0 - 125 pg/mL  HEMOGLOBIN A1C     Status: None   Collection Time    03/19/13 12:55 PM      Result Value Range   Hemoglobin A1C 5.4  <5.7 %   Comment: (NOTE)                                                                               According to the ADA Clinical Practice Recommendations for 2011, when     HbA1c is used as a screening test:      >=6.5%   Diagnostic of Diabetes Mellitus               (if abnormal result is confirmed)     5.7-6.4%   Increased risk of developing Diabetes Mellitus     References:Diagnosis and Classification of Diabetes Mellitus,Diabetes     Care,2011,34(Suppl 1):S62-S69 and Standards of Medical Care in             Diabetes - 2011,Diabetes Care,2011,34 (Suppl 1):S11-S61.   Mean Plasma Glucose 108  <117 mg/dL   Comment: Performed at [facility]  TROPONIN I     Status: None   Collection Time    03/19/13  7:51 PM      Result Value Range   Troponin I <0.30  <0.30 ng/mL   Comment:            Due to the release kinetics  of cTnI,     a negative result within the first hours     of the onset of symptoms does not rule out     myocardial infarction with certainty.     If myocardial infarction is still suspected,     repeat the test at appropriate intervals.  LIPID PANEL     Status: Abnormal   Collection Time    03/20/13  4:48 AM      Result Value Range   Cholesterol 201 (*) 0 -  200 mg/dL   Triglycerides 962 (*) <150 mg/dL   HDL 28 (*) >95 mg/dL   Total CHOL/HDL Ratio 7.2     VLDL 45 (*) 0 - 40 mg/dL   LDL Cholesterol 284 (*) 0 - 99 mg/dL   Comment:            Total Cholesterol/HDL:CHD Risk     Coronary Heart Disease Risk Table                         Men   Women      1/2 Average Risk   3.4   3.3      Average Risk       5.0   4.4      2 X Average Risk   9.6   7.1      3 X Average Risk  23.4   11.0                Use the calculated Patient Ratio     above and the CHD Risk Table     to determine the patient's CHD Risk.                ATP III CLASSIFICATION (LDL):      <100     mg/dL   Optimal      132-440  mg/dL   Near or Above                        Optimal      130-159  mg/dL   Borderline      102-725  mg/dL   High      >366     mg/dL   Very High  CBC     Status: None   Collection Time    03/20/13  4:48 AM      Result Value Range   WBC 7.1  4.0 - 10.5 K/uL   RBC 4.26  4.22 - 5.81 MIL/uL   Hemoglobin 14.0  13.0 - 17.0 g/dL   HCT 44.0  34.7 - 42.5 %   MCV 94.6  78.0 - 100.0 fL   MCH 32.9  26.0 - 34.0 pg   MCHC 34.7  30.0 - 36.0 g/dL   RDW 95.6  38.7 - 56.4 %   Platelets 181  150 - 400 K/uL  BASIC METABOLIC PANEL     Status: Abnormal   Collection Time    03/20/13  4:48 AM      Result Value Range   Sodium 142  135 - 145 mEq/L   Potassium 4.2  3.5 - 5.1 mEq/L   Chloride 107  96 - 112 mEq/L   CO2 26  19 - 32 mEq/L   Glucose, Bld 100 (*) 70 - 99 mg/dL   BUN 14  6 - 23 mg/dL   Creatinine, Ser 3.32  0.50 - 1.35 mg/dL   Calcium 9.1  8.4 - 10.5 mg/dL   GFR calc non Af Amer >90  >90 mL/min   GFR calc Af Amer >90  >90 mL/min   Comment: (NOTE)     The eGFR has been calculated using the CKD EPI equation.     This calculation has not been validated in all clinical situations.     eGFR's persistently <90 mL/min signify possible Chronic Kidney     Disease.    Imaging: Portable Chest X-ray 1 View  03/19/2013   CLINICAL DATA:  Angina.  EXAM:  PORTABLE CHEST - 1 VIEW  COMPARISON:  06/20/2011  FINDINGS: Portable view of the chest demonstrates low lung volumes. There is no focal airspace disease and no significant pulmonary edema. Heart and mediastinum are within normal limits. Trachea is midline.  IMPRESSION: Low lung volumes without focal disease.   Electronically Signed   By: Richarda Overlie M.D.   On: 03/19/2013 13:33    Assessment:  1. Principal Problem: 2.   Unstable angina - in setting of Afib RVR (Newly diagnosed) 3. Active Problems: 4.   Hyperlipidemia 5.   PAF (paroxysmal atrial fibrillation), symptomatic 6.   Angina at rest, secondary to ischemia VS. PAF 7.   Plan:  1. Chest pain has resolved, likely abnormal feeling secondary to a-fib with RVR. He is in sinus, started on flecainide.  QTC okay today. Coronaries normal.  Given his young age and relatively few risk factors.  His CHADS2VASC score is 0.  His burden of a-fib recently was low. I do not think anticoagulation is indicated.  Follow-up with Dr. Allyson Sabal or MLP in 5-7 days with repeat EKG in the office.  Time Spent Directly with Patient:  15 minutes  Length of Stay:  LOS: 1 day   Chrystie Nose, MD, [facility] Attending Cardiologist [facility]  Galya Dunnigan C 03/20/2013, 8:54 AM

## 2013-03-20 NOTE — Discharge Summary (Signed)
Patient ID: Jimmy Baldwin,  MRN: [id number], DOB/AGE: May 13, 1971 42 y.o.  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 EKG in the office showed SR without changes. He has had a  previous cath in 2006 with normal coronary arteries. While in the office the pt complained of increasing chest pain and fast heart rate. He EKG was repeated  and showed PAF, RVR. Discussed with Dr. Allyson Sabal and plan for admit to rule out ischemic cause with cath. Cath done 03/19/13 revealed Nl coronaries and LVF. The pt was placed on Flecainide 50 mg BID and ASA. On 03/20/13 he is in NSR with a QTc of 429. Dr Rennis Golden feels he can be discharged. He will see Dr Allyson Sabal in two weeks.    Discharge Vitals:  Blood pressure 119/74, pulse 81, temperature 97.5 F (36.4 C), temperature source Oral, resp. rate 13, height 5\' 9"  (1.753 m), weight 211 lb 10.3 oz (96 kg), SpO2 91.00%.    Labs: Results for orders placed during the hospital encounter of 03/19/13 (from the past 48 hour(s))  MRSA PCR SCREENING     Status: None   Collection Time    03/19/13 12:21 PM      Result Value Range   MRSA by PCR NEGATIVE  NEGATIVE   Comment:            The GeneXpert MRSA Assay (FDA     approved for NASAL specimens     only), is one component of a     comprehensive MRSA colonization     surveillance program. It is not     intended  to diagnose MRSA     infection nor to guide or     monitor treatment for     MRSA infections.  TROPONIN I     Status: None   Collection Time    03/19/13 12:55 PM      Result Value Range   Troponin I <0.30  <0.30 ng/mL   Comment:            Due to the release kinetics of cTnI,     a negative result within the first hours     of the onset of symptoms does not rule out     myocardial infarction with certainty.     If myocardial infarction is still suspected,     repeat the test at appropriate intervals.  PROTIME-INR     Status: None   Collection Time    03/19/13 12:55 PM      Result Value Range   Prothrombin Time 13.3  11.6 - 15.2 seconds   INR 1.03  0.00 - 1.49  APTT     Status: Abnormal   Collection Time    03/19/13 12:55 PM      Result Value Range   aPTT 176 (*) 24 - 37 seconds   Comment:  IF BASELINE aPTT IS ELEVATED,     SUGGEST PATIENT RISK ASSESSMENT     BE USED TO DETERMINE APPROPRIATE     ANTICOAGULANT THERAPY.  CBC WITH DIFFERENTIAL     Status: None   Collection Time    03/19/13 12:55 PM      Result Value Range   WBC 7.0  4.0 - 10.5 K/uL   RBC 4.68  4.22 - 5.81 MIL/uL   Hemoglobin 15.3  13.0 - 17.0 g/dL   HCT 16.1  09.6 - 04.5 %   MCV 90.8  78.0 - 100.0 fL   MCH 32.7  26.0 - 34.0 pg   MCHC 36.0  30.0 - 36.0 g/dL   RDW 40.9  81.1 - 91.4 %   Platelets 215  150 - 400 K/uL   Neutrophils Relative % 62  43 - 77 %   Neutro Abs 4.3  1.7 - 7.7 K/uL   Lymphocytes Relative 25  12 - 46 %   Lymphs Abs 1.8  0.7 - 4.0 K/uL   Monocytes Relative 10  3 - 12 %   Monocytes Absolute 0.7  0.1 - 1.0 K/uL   Eosinophils Relative 2  0 - 5 %   Eosinophils Absolute 0.2  0.0 - 0.7 K/uL   Basophils Relative 0  0 - 1 %   Basophils Absolute 0.0  0.0 - 0.1 K/uL  TSH     Status: None   Collection Time    03/19/13 12:55 PM      Result Value Range   TSH 3.810  0.350 - 4.500 uIU/mL   Comment: Performed at [facility]  COMPREHENSIVE METABOLIC PANEL     Status: None    Collection Time    03/19/13 12:55 PM      Result Value Range   Sodium 139  135 - 145 mEq/L   Potassium 4.4  3.5 - 5.1 mEq/L   Chloride 101  96 - 112 mEq/L   CO2 26  19 - 32 mEq/L   Glucose, Bld 82  70 - 99 mg/dL   BUN 10  6 - 23 mg/dL   Creatinine, Ser 7.82  0.50 - 1.35 mg/dL   Calcium 9.5  8.4 - 95.6 mg/dL   Total Protein 7.9  6.0 - 8.3 g/dL   Albumin 4.6  3.5 - 5.2 g/dL   AST 29  0 - 37 U/L   ALT 38  0 - 53 U/L   Alkaline Phosphatase 89  39 - 117 U/L   Total Bilirubin 0.7  0.3 - 1.2 mg/dL   GFR calc non Af Amer >90  >90 mL/min   GFR calc Af Amer >90  >90 mL/min   Comment: (NOTE)     The eGFR has been calculated using the CKD EPI equation.     This calculation has not been validated in all clinical situations.     eGFR's persistently <90 mL/min signify possible Chronic Kidney     Disease.  MAGNESIUM     Status: None   Collection Time    03/19/13 12:55 PM      Result Value Range   Magnesium 2.2  1.5 - 2.5 mg/dL  PRO B NATRIURETIC PEPTIDE     Status: None   Collection Time    03/19/13 12:55 PM      Result Value Range   Pro B Natriuretic peptide (BNP) 8.6  0 - 125 pg/mL  HEMOGLOBIN A1C     Status: None   Collection  Time    03/19/13 12:55 PM      Result Value Range   Hemoglobin A1C 5.4  <5.7 %   Comment: (NOTE)                                                                               According to the ADA Clinical Practice Recommendations for 2011, when     HbA1c is used as a screening test:      >=6.5%   Diagnostic of Diabetes Mellitus               (if abnormal result is confirmed)     5.7-6.4%   Increased risk of developing Diabetes Mellitus     References:Diagnosis and Classification of Diabetes Mellitus,Diabetes     Care,2011,34(Suppl 1):S62-S69 and Standards of Medical Care in             Diabetes - 2011,Diabetes Care,2011,34 (Suppl 1):S11-S61.   Mean Plasma Glucose 108  <117 mg/dL   Comment: Performed at [facility]  TROPONIN I     Status: None    Collection Time    03/19/13  7:51 PM      Result Value Range   Troponin I <0.30  <0.30 ng/mL   Comment:            Due to the release kinetics of cTnI,     a negative result within the first hours     of the onset of symptoms does not rule out     myocardial infarction with certainty.     If myocardial infarction is still suspected,     repeat the test at appropriate intervals.  LIPID PANEL     Status: Abnormal   Collection Time    03/20/13  4:48 AM      Result Value Range   Cholesterol 201 (*) 0 - 200 mg/dL   Triglycerides 161 (*) <150 mg/dL   HDL 28 (*) >09 mg/dL   Total CHOL/HDL Ratio 7.2     VLDL 45 (*) 0 - 40 mg/dL   LDL Cholesterol 604 (*) 0 - 99 mg/dL   Comment:            Total Cholesterol/HDL:CHD Risk     Coronary Heart Disease Risk Table                         Men   Women      1/2 Average Risk   3.4   3.3      Average Risk       5.0   4.4      2 X Average Risk   9.6   7.1      3 X Average Risk  23.4   11.0                Use the calculated Patient Ratio     above and the CHD Risk Table     to determine the patient's CHD Risk.                ATP III CLASSIFICATION (LDL):      <100     mg/dL  Optimal      100-129  mg/dL   Near or Above                        Optimal      130-159  mg/dL   Borderline      161-096  mg/dL   High      >045     mg/dL   Very High  CBC     Status: None   Collection Time    03/20/13  4:48 AM      Result Value Range   WBC 7.1  4.0 - 10.5 K/uL   RBC 4.26  4.22 - 5.81 MIL/uL   Hemoglobin 14.0  13.0 - 17.0 g/dL   HCT 40.9  81.1 - 91.4 %   MCV 94.6  78.0 - 100.0 fL   MCH 32.9  26.0 - 34.0 pg   MCHC 34.7  30.0 - 36.0 g/dL   RDW 78.2  95.6 - 21.3 %   Platelets 181  150 - 400 K/uL  BASIC METABOLIC PANEL     Status: Abnormal   Collection Time    03/20/13  4:48 AM      Result Value Range   Sodium 142  135 - 145 mEq/L   Potassium 4.2  3.5 - 5.1 mEq/L   Chloride 107  96 - 112 mEq/L   CO2 26  19 - 32 mEq/L   Glucose, Bld 100 (*) 70 -  99 mg/dL   BUN 14  6 - 23 mg/dL   Creatinine, Ser 0.86  0.50 - 1.35 mg/dL   Calcium 9.1  8.4 - 57.8 mg/dL   GFR calc non Af Amer >90  >90 mL/min   GFR calc Af Amer >90  >90 mL/min   Comment: (NOTE)     The eGFR has been calculated using the CKD EPI equation.     This calculation has not been validated in all clinical situations.     eGFR's persistently <90 mL/min signify possible Chronic Kidney     Disease.    Disposition:  Follow-up Information   Follow up with Runell Gess, MD. (office will call you)    Specialty:  Cardiology   Contact information:   [address] [place] [address] [phone]       Discharge Medications:    Medication List         acetaminophen 325 MG tablet  Commonly known as:  TYLENOL  Take 2 tablets (650 mg total) by mouth every 4 (four) hours as needed.     aspirin 81 MG EC tablet  Take 1 tablet (81 mg total) by mouth daily.     flecainide 50 MG tablet  Commonly known as:  TAMBOCOR  Take 1 tablet (50 mg total) by mouth every 12 (twelve) hours.     ibuprofen 200 MG tablet  Commonly known as:  ADVIL,MOTRIN  Take 600 mg by mouth daily as needed for pain.     metoprolol tartrate 12.5 mg Tabs tablet  Commonly known as:  LOPRESSOR  Take 0.5 tablets (12.5 mg total) by mouth 2 (two) times daily.         Duration of Discharge Encounter: Greater than 30 minutes including physician time.  Jolene Provost PA-C 03/20/2013 9:29 AM

## 2013-03-24 ENCOUNTER — Telehealth: Payer: Self-pay | Admitting: Cardiovascular Disease

## 2013-03-24 NOTE — Telephone Encounter (Signed)
Please call him after 11:30 if possible.He had a cath on 03-19-13,he is having severe lower back pain.He was wondering if the medicine could be causing this?

## 2013-03-24 NOTE — Telephone Encounter (Signed)
Reviewed chart and discussed w/ Corine Shelter, PA-C.  Pt has h/o lower back pain and disc disease.  Advised meds should not be causing back pain.  See PCP.  Returned call and pt verified x 2.  Pt c/o lower back pain and wanted to know if the beta blocker or flecainide would be causing his pain.  Pt informed per chart, he has a h/o lower back pain and disc disease.  Pt stated he does and just wanted to make sure it wasn't the medicine.  Pt advised to see PCP r/t back pain and informed meds should not cause back pain.  Pt verbalized understanding and agreed w/ plan.  Stated he will call PCP.

## 2013-04-08 ENCOUNTER — Encounter: Payer: Self-pay | Admitting: Cardiovascular Disease

## 2013-04-08 ENCOUNTER — Ambulatory Visit (INDEPENDENT_AMBULATORY_CARE_PROVIDER_SITE_OTHER): Payer: BC Managed Care – PPO | Admitting: Cardiovascular Disease

## 2013-04-08 VITALS — BP 128/72 | HR 73 | Ht 69.0 in | Wt 223.0 lb

## 2013-04-08 DIAGNOSIS — R079 Chest pain, unspecified: Secondary | ICD-10-CM

## 2013-04-08 DIAGNOSIS — R5381 Other malaise: Secondary | ICD-10-CM

## 2013-04-08 DIAGNOSIS — Z79899 Other long term (current) drug therapy: Secondary | ICD-10-CM

## 2013-04-08 DIAGNOSIS — R5383 Other fatigue: Secondary | ICD-10-CM

## 2013-04-08 DIAGNOSIS — E785 Hyperlipidemia, unspecified: Secondary | ICD-10-CM

## 2013-04-08 DIAGNOSIS — I4891 Unspecified atrial fibrillation: Secondary | ICD-10-CM

## 2013-04-08 DIAGNOSIS — I480 Paroxysmal atrial fibrillation: Secondary | ICD-10-CM

## 2013-04-08 MED ORDER — FENOFIBRATE 48 MG PO TABS: 48.0000 mg | ORAL_TABLET | Freq: Every day | ORAL | Status: DC

## 2013-04-08 MED ORDER — METOPROLOL TARTRATE 25 MG PO TABS: 25.0000 mg | ORAL_TABLET | Freq: Two times a day (BID) | ORAL | Status: DC

## 2013-04-08 NOTE — Patient Instructions (Signed)
  Your physician wants you to follow-up with him in : 6 months with Dr Allyson Sabal                                            and with an extender in : 3 months                    You will receive a reminder letter in the mail one month in advance. If you don't receive a letter, please call our office to schedule the follow-up appointment.   Your physician recommends that you return for lab work in: 3 months, fasting   Your physician has recommended you make the following change in your medication: Increase Metoprolol to 25mg  twice a day. Start Fenofibrate 48mg  daily

## 2013-04-08 NOTE — Assessment & Plan Note (Signed)
Statin intolerance. This was her level is over 200. I'm going to begin him on a low-dose fibrate

## 2013-04-08 NOTE — Assessment & Plan Note (Signed)
Recent cardiac catheterization performed radially by Dr. Herbie Baltimore on 03/19/13 and was entirely normal.

## 2013-04-08 NOTE — Progress Notes (Signed)
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 At times chest pain alone. 1 st episode he was at his desk developed a fullness in throat, then burning and pressure in his chest and rapid heart rate. He had a Latte that am and thought that was cause, but he also thought he was having a heart attack. Slowly his heart rate slowed And he felt better. The pai though has been mildly present since that time. He has 4 episodes of significant pain with fast heart rate. Last night was another bad episode.  Initial EKG SR without changes, ( previous cath in 2006 with normal coronary arteries). He was seen in the office on 1031 and 14 with chest pain and tachycardia palpitations. He had A. Fib with RVR. He was admitted to the hospital and underwent cardiac catheterization by Dr. Susette Racer revealing normal noted and normal LV function. He converted to sinus rhythm he was placed on flecainide. He continues to have some discomfort in his chest and has had no recurrent palpitations though he feels "jittery".   Current Outpatient Prescriptions  Medication Sig Dispense Refill  . acetaminophen (TYLENOL) 325 MG tablet Take 2 tablets (650 mg total) by mouth every 4 (four) hours as needed.      Marland Kitchen aspirin EC 81 MG EC tablet Take 1 tablet (81 mg total) by mouth daily.      . flecainide (TAMBOCOR) 50 MG tablet Take 1 tablet (50 mg total) by mouth every 12 (twelve) hours.  60 tablet  11  . ibuprofen (ADVIL,MOTRIN) 200 MG tablet Take 600 mg by mouth daily as needed for pain.      . metoprolol tartrate (LOPRESSOR) 25 MG tablet Take 1 tablet (25 mg total) by mouth 2 (two) times daily.  30 tablet  11  . zolpidem (AMBIEN) 5 MG tablet Take 5 mg by mouth  at bedtime as needed for sleep.      . fenofibrate (TRICOR) 48 MG tablet Take 1 tablet (48 mg total) by mouth daily.  30 tablet  11   No current facility-administered medications for this visit.    No Known Allergies  History   Social History  . Marital Status: Married    Spouse Name: N/A    Number of Children: N/A  . 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 Smoking status: Former Smoker    Quit date: 01/16/2003  . Smokeless tobacco: Never Used  . Alcohol Use: No  . Drug Use: No  . Sexual Activity: Not on file   Other Topics Concern  . Not on file   Social History Narrative  . No narrative on file     Review of Systems: General: negative for chills, fever, night sweats or weight changes.  Cardiovascular: negative for chest pain, dyspnea on exertion, edema, orthopnea, palpitations, paroxysmal nocturnal dyspnea or shortness of breath Dermatological: negative for rash Respiratory: negative for cough or wheezing Urologic: negative for hematuria Abdominal: negative for nausea, vomiting, diarrhea, bright red blood per rectum, melena, or hematemesis Neurologic: negative for visual changes, syncope, or dizziness All other systems reviewed and are otherwise negative except as noted  above.    Blood pressure 128/72, pulse 73, height 5\' 9"  (1.753 m), weight 223 lb (101.152 kg).  General appearance: alert and no distress Neck: no adenopathy, no carotid bruit, no JVD, supple, symmetrical, trachea midline and thyroid not enlarged, symmetric, no tenderness/mass/nodules Lungs: clear to auscultation bilaterally Heart: regular rate and rhythm, S1, S2 normal, no murmur, click, rub or gallop Extremities: extremities normal, atraumatic, no cyanosis or edema  EKG normal sinus rhythm 73 without ST or T wave changes. His QTc interval was 429 ms  ASSESSMENT AND PLAN:   Hyperlipidemia Statin intolerance. This was her level is over 200.  I'm going to begin him on a low-dose fibrate   PAF, symptomatic- Flecainide initiated 03/19/13 He was in A. Fib with RVR when I admitted him to the hospital with chest pain. His cardiac catheterization performed radially by Dr. Herbie Baltimore was completely normal. He converted  To sinus rhythm was placed on flecainide. He's had no recurrent episodes of PAF though he says he feels "jittery".His QTC intervals today is 429 ms. I'm going to increase his beta blocker from 12.5 mg twice a day to 25 mg by mouth twice a day of metoprolol. His CHA2DS2VASC score is low and therefore he does not require an oral anti-coagulant  Chest pain with normal coronary angiography 2006 and 03/19/13 Recent cardiac catheterization performed radially by Dr. Herbie Baltimore on 03/19/13 and was entirely normal.      Runell Gess MD [facility], [facility] 04/08/2013 9:17 AM

## 2013-04-08 NOTE — Assessment & Plan Note (Signed)
He was in A. Fib with RVR when I admitted him to the hospital with chest pain. His cardiac catheterization performed radially by Dr. Herbie Baltimore was completely normal. He converted  To sinus rhythm was placed on flecainide. He's had no recurrent episodes of PAF though he says he feels "jittery".His QTC intervals today is 429 ms. I'm going to increase his beta blocker from 12.5 mg twice a day to 25 mg by mouth twice a day of metoprolol. His CHA2DS2VASC score is low and therefore he does not require an oral anti-coagulant

## 2013-04-21 LAB — T4, FREE: Free T4: 1.08 ng/dL (ref 0.80–1.80)

## 2013-04-21 LAB — TSH: TSH: 2.939 u[IU]/mL (ref 0.350–4.500)

## 2013-04-29 ENCOUNTER — Encounter: Payer: Self-pay | Admitting: *Deleted

## 2013-07-05 ENCOUNTER — Other Ambulatory Visit: Payer: Self-pay | Admitting: *Deleted

## 2013-07-05 DIAGNOSIS — I4891 Unspecified atrial fibrillation: Secondary | ICD-10-CM

## 2013-07-05 MED ORDER — METOPROLOL TARTRATE 25 MG PO TABS: 25.0000 mg | ORAL_TABLET | Freq: Two times a day (BID) | ORAL | Status: DC

## 2013-08-30 IMAGING — CR DG CHEST 2V
2 series · 2 of 2 positions shown · non-contrast
Comparison: None.

CLINICAL DATA: Wheezing.

CHEST - 2 VIEW

[view not recorded (1 of 2)]
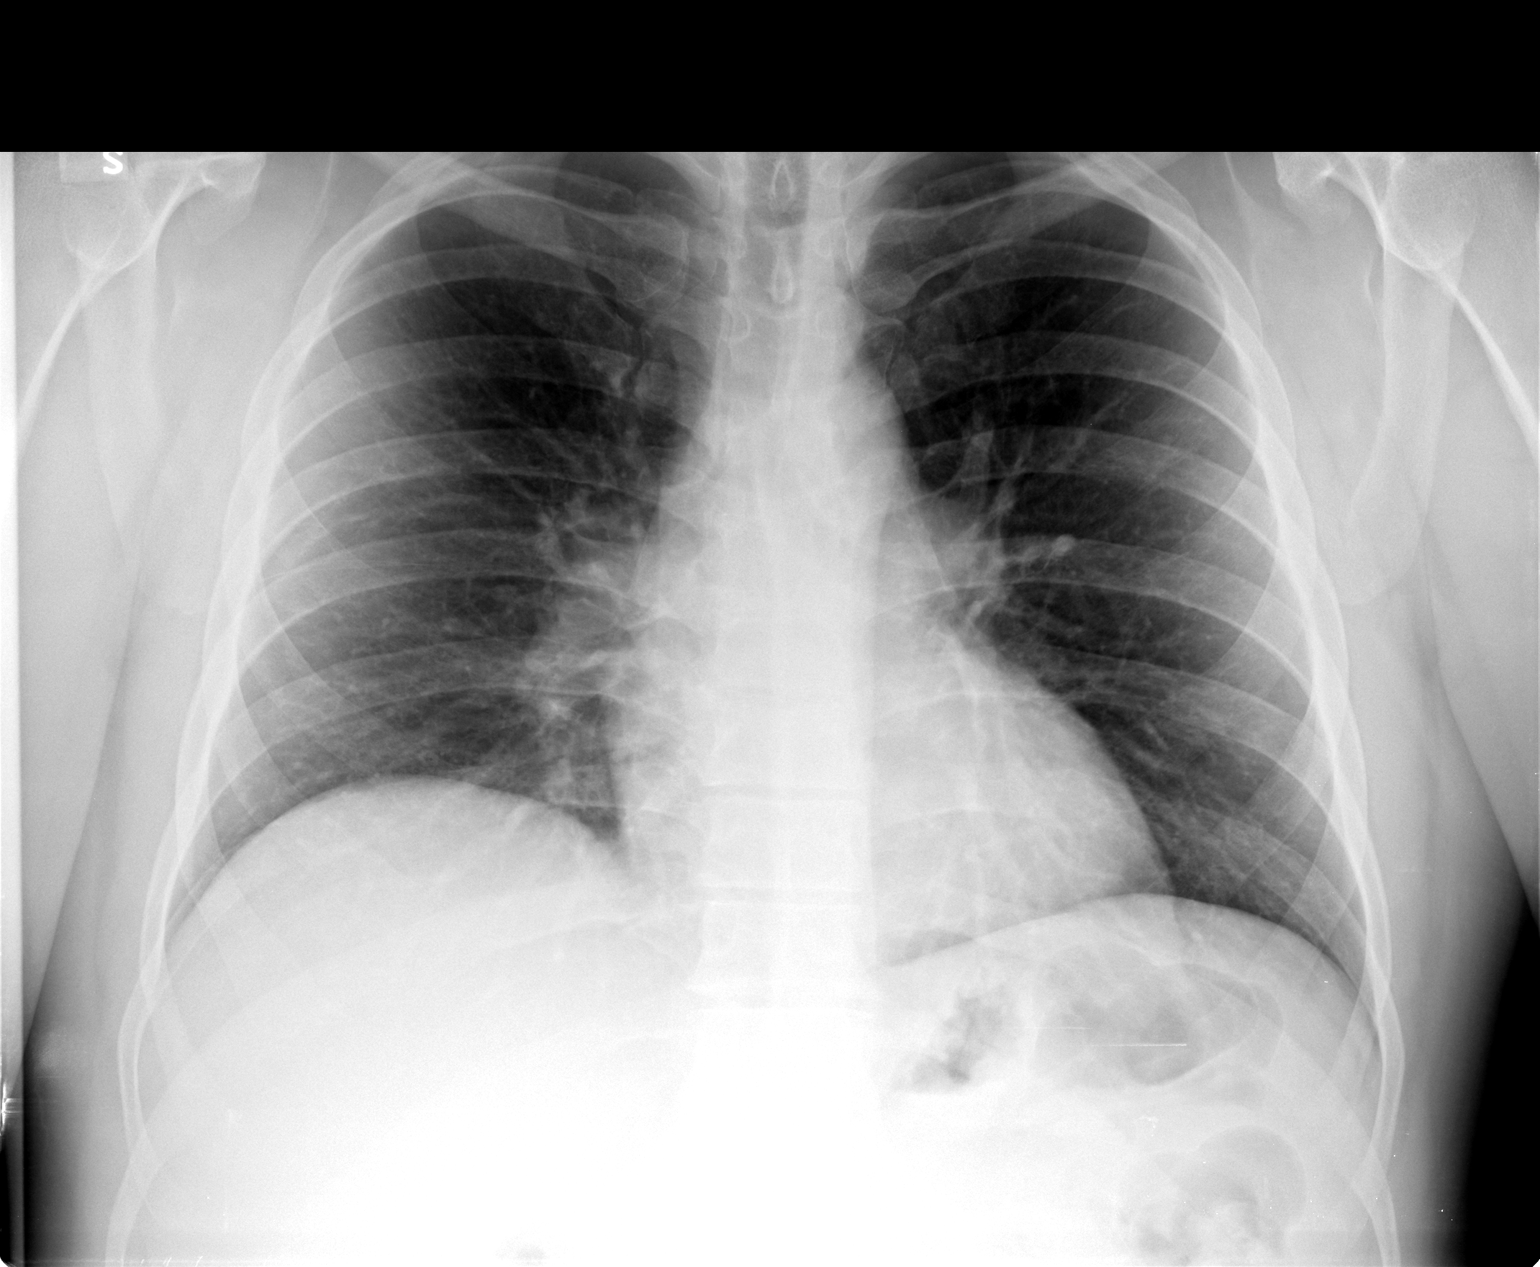

[view not recorded (2 of 2)]
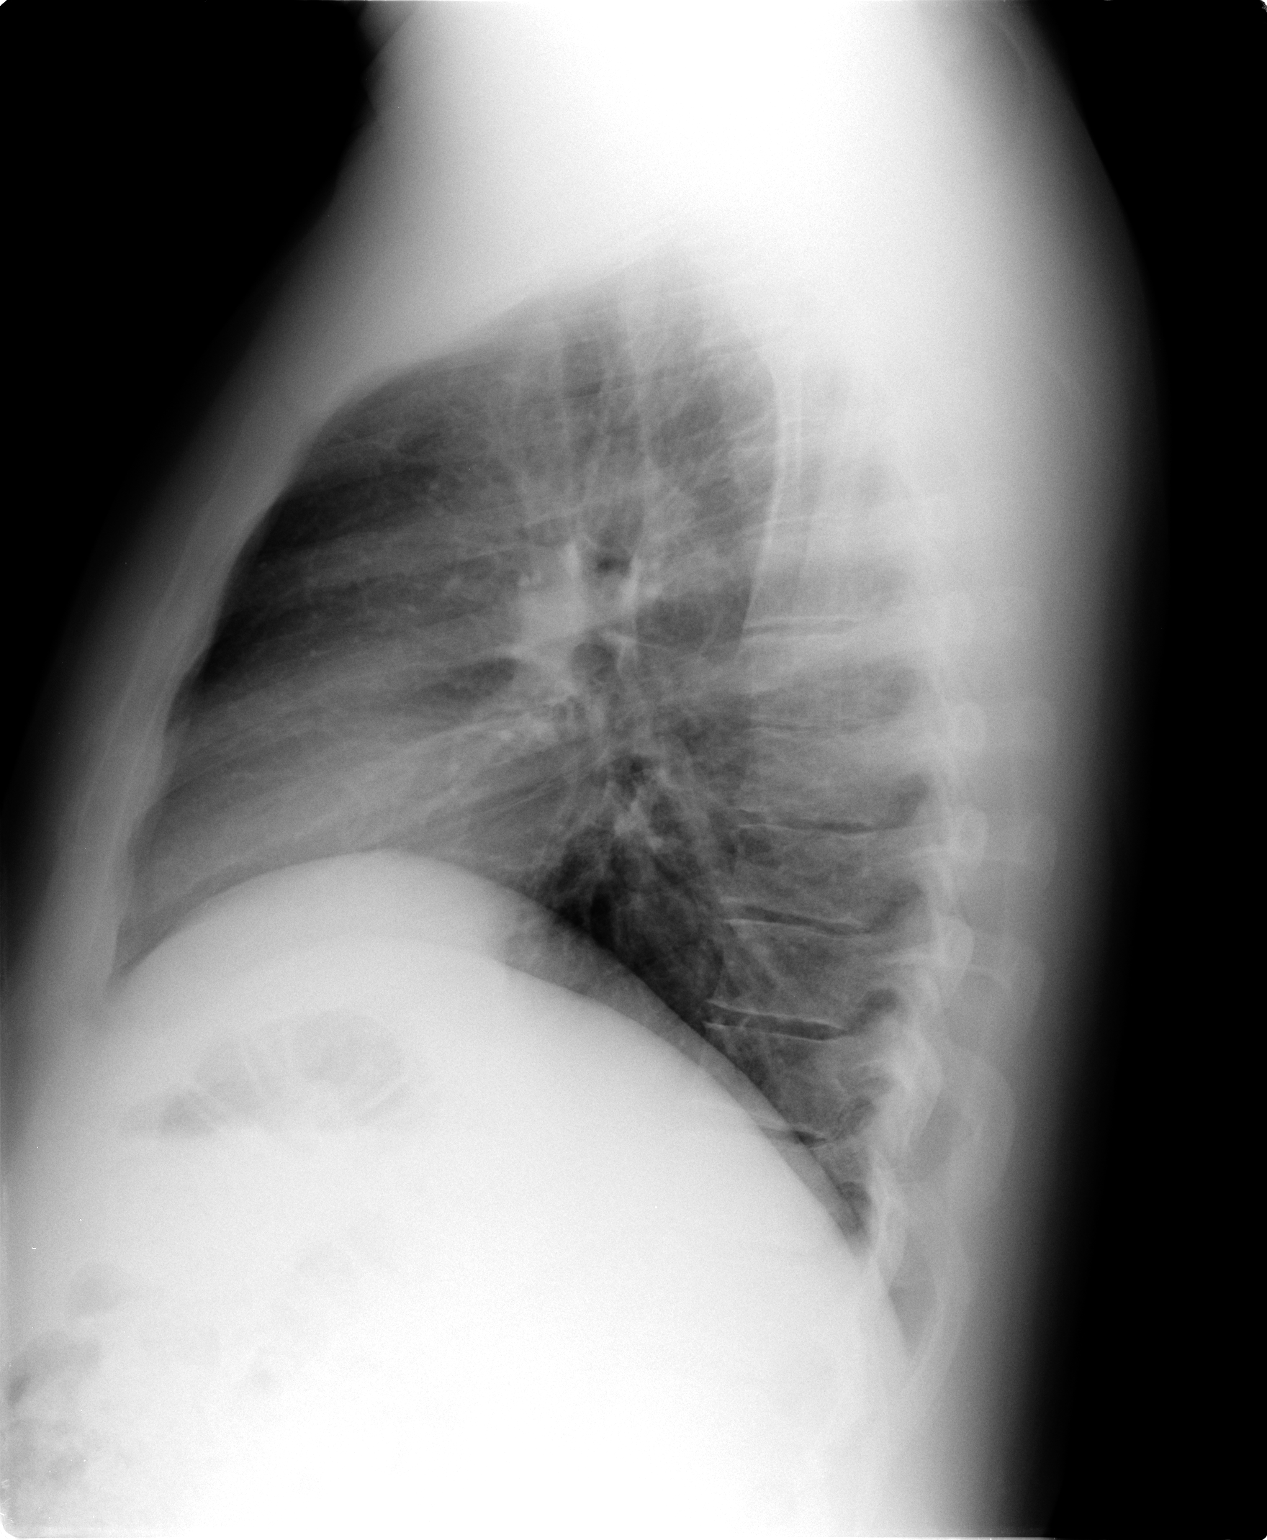

[2 of 2 positions shown; findings below may reference images not displayed]

FINDINGS: Trachea is midline.  Heart size normal.  Lungs are
somewhat low in volume but clear.  No pleural fluid.
IMPRESSION: No acute findings.

## 2013-12-02 ENCOUNTER — Telehealth: Payer: Self-pay | Admitting: Cardiovascular Disease

## 2013-12-02 DIAGNOSIS — I4891 Unspecified atrial fibrillation: Secondary | ICD-10-CM

## 2013-12-02 MED ORDER — METOPROLOL TARTRATE 25 MG PO TABS: 25.0000 mg | ORAL_TABLET | Freq: Two times a day (BID) | ORAL | Status: DC

## 2013-12-02 MED ORDER — FENOFIBRATE 48 MG PO TABS: 48.0000 mg | ORAL_TABLET | Freq: Every day | ORAL | Status: DC

## 2013-12-02 MED ORDER — FLECAINIDE ACETATE 50 MG PO TABS: 50.0000 mg | ORAL_TABLET | Freq: Two times a day (BID) | ORAL | Status: DC

## 2013-12-02 NOTE — Telephone Encounter (Signed)
This message came from the answering service. I'm out of town without 3 of my medications,Please call them to [phone]. He did not say what 3 medicine he was out.

## 2013-12-02 NOTE — Telephone Encounter (Signed)
[facility] was [facility] to [facility] [place] [place] - patient is out of town and forgot medications.

## 2014-01-06 ENCOUNTER — Telehealth: Payer: Self-pay | Admitting: Cardiovascular Disease

## 2014-01-06 NOTE — Telephone Encounter (Signed)
Pt called in inquiring about possibly having a stress test done . He stated that he has been feeling "weird" lately; it was hard for him to articulate HOW he was felling but that was his reasoning for wanting to be seen sometime sooner. I scheduled him to see Vernona RiegerLaura on 9/09. Please call  Thanks

## 2014-01-06 NOTE — Telephone Encounter (Signed)
SPOKE TO PATIENT.INFORMED PATIENT UNABLE TO STRESS TES WITH EXAMINATION/OR DIAGNOSIS PATIENT STATES HE IS NOT HAVING CHEST PAIN, FEELS LIKES WHEN YOU TAKE A DEEP BREATHE IN THE COLD. HE STATES HE HAS BEEN TAKING EXTRA METOPROLOL AND FLECAINDE.  MOVED APPOINTMENT UP FROM 01/26/14 TO 01/14/14. PATIENT IS AWARE.

## 2014-01-14 ENCOUNTER — Institutional Professional Consult (permissible substitution): Payer: BC Managed Care – PPO | Admitting: Cardiology

## 2014-01-19 ENCOUNTER — Encounter: Payer: Self-pay | Admitting: Cardiology

## 2014-01-19 ENCOUNTER — Ambulatory Visit (INDEPENDENT_AMBULATORY_CARE_PROVIDER_SITE_OTHER): Payer: BC Managed Care – PPO | Admitting: Cardiology

## 2014-01-19 VITALS — BP 100/64 | HR 61 | Ht 69.0 in | Wt 225.0 lb

## 2014-01-19 DIAGNOSIS — I4891 Unspecified atrial fibrillation: Secondary | ICD-10-CM

## 2014-01-19 DIAGNOSIS — I480 Paroxysmal atrial fibrillation: Secondary | ICD-10-CM

## 2014-01-19 DIAGNOSIS — R079 Chest pain, unspecified: Secondary | ICD-10-CM

## 2014-01-19 NOTE — Progress Notes (Signed)
01/19/2014 Jimmy [facility]   [birth year]  [id number]  Primary Physicia Oliver Barre, MD Primary Cardiologist: Dr. Allyson Sabal  HPI:  The patient is a 43 year old male, followed by Dr. Allyson Sabal. He has a past medical history significant for paroxysmal atrial fibrillation and hyperlipidemia. His atrial fibrillation was first diagnosed in October 2014, when he presented to [facility] with symptoms of palpitations and dyspnea. An EKG revealed atrial fibrillation with RVR. He ultimately underwent a cardiac catheterization, performed by Dr. Herbie Baltimore, which revealed normal coronary arteries and normal LV function. He ultimately spontaneously converted to sinus rhythm and was placed on flecainide, as well as metoprolol. Due to a low CHADSVASC score, chronic oral anticoagulation was not indicated. His last office visit with Dr. Allyson Sabal was 04/08/2013. At that time, an EKG revealed normal sinus rhythm and he was felt to be stable from a cardiac standpoint.  He presents to clinic today for evaluation. He reports that several weeks ago he had an abnormal sensation in his chest . He reports that his heart felt "jittery". This was different from the palpitations he experienced when he was first diagnosed with atrial fibrillation. He denies any associated chest pain, dyspnea, dizziness or lightheadedness. No syncope or near-syncope. He states that he was concerned that it may have been recurrent atrial fibrillation so he can per air leak increased both his flecainide and his metoprolol. This improved his symptoms slightly however this continued for about 4 days. The sensation in his chest ultimately resolved. He denies any excessive caffeine intake during this period of time. He does note that he has had some anxiety over the past few weeks, as his father recently passed away. He continues to have occasional symptoms intermittently but they're often short lived. He reports full medication compliance. He is back on his  previously prescribed doses of flecainide and metoprolol. His concerns are that his recent episodes may have been atrial fibrillation and that he may not be on the appropriate dose of medications.   Current Outpatient Prescriptions  Medication Sig Dispense Refill  . acetaminophen (TYLENOL) 325 MG tablet Take 2 tablets (650 mg total) by mouth every 4 (four) hours as needed.      . fenofibrate (TRICOR) 48 MG tablet Take 1 tablet (48 mg total) by mouth daily.  3 tablet  0  . flecainide (TAMBOCOR) 50 MG tablet Take 1 tablet (50 mg total) by mouth every 12 (twelve) hours.  6 tablet  0  . ibuprofen (ADVIL,MOTRIN) 200 MG tablet Take 600 mg by mouth daily as needed for pain.      . metoprolol tartrate (LOPRESSOR) 25 MG tablet Take 1 tablet (25 mg total) by mouth 2 (two) times daily.  6 tablet  0  . zolpidem (AMBIEN) 5 MG tablet Take 5 mg by mouth at bedtime as needed for sleep.       No current facility-administered medications for this visit.    No Known Allergies  History   Social History  . Marital Status: Married    Spouse Name: N/A    Number of Children: N/A  . 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 Smoking status: Former Smoker    Quit date: 01/16/2003  . Smokeless tobacco: Never Used  . Alcohol Use: No  . Drug Use: No  . Sexual Activity: Not on file   Other Topics Concern  . Not on file   Social History Narrative  .  No narrative on file     Review of Systems: General: negative for chills, fever, night sweats or weight changes.  Cardiovascular: negative for chest pain, dyspnea on exertion, edema, orthopnea, palpitations, paroxysmal nocturnal dyspnea or shortness of breath Dermatological: negative for rash Respiratory: negative for cough or wheezing Urologic: negative for hematuria Abdominal: negative for nausea, vomiting, diarrhea, bright red blood per rectum, melena, or hematemesis Neurologic: negative for visual  changes, syncope, or dizziness All other systems reviewed and are otherwise negative except as noted above.    Blood pressure 100/64, pulse 61, height  (1.753 m), weight 225 lb (102.059 kg).  General appearance: alert, cooperative and no distress Neck: no carotid bruit and no JVD Lungs: clear to auscultation bilaterally Heart: regular rate and rhythm Extremities: no LEE Pulses: 2+ and symmetric Skin: warm and dry Neurologic: Grossly normal  EKG NSR. HR 61 bpm  ASSESSMENT AND PLAN:   1. PAF: currently in NSR. HR 61 bpm. He is on both flecainide and metoprolol. No anticoagulation due to low CHADSVASC score. His concerns is that he may not be on the appropriate dose of medication. However I'm not totally convinced that his recent symptoms are due to atrial fibrillation. I recommended that we evaluate him with a two-week cardiac event monitor to reassess whether his symptoms correlate with recurrent atrial fibrillation and to assess degree of afib burden, if so, to determine whether or not he would need further increases in his flecainide. However, the patient declined evaluation with a heart monitor at this time. I also advised against increasing his metoprolol given that his resting heart rate is 61 beats per minute. He wishes to continue his current treatment as prescribed and will consider a cardiac monitor further down the road, if he continues to have recurrent symptoms. I feel that this is reasonable.   PLAN  No change in therapy for now .F/U with Dr. Allyson Sabal in 4-6 months.   SIMMONS, BRITTAINYPA-C 01/19/2014 8:44 AM

## 2014-01-19 NOTE — Patient Instructions (Signed)
Your physician wants you to follow-up in: 4 months with Dr. Allyson Sabal at [facility].  You will receive a reminder letter in the mail two months in advance. If you don't receive a letter, please call our office to schedule the follow-up appointment.  Your physician recommends that you continue on your current medications as directed. Please refer to the Current Medication list given to you today.

## 2014-01-26 ENCOUNTER — Ambulatory Visit: Payer: BC Managed Care – PPO | Admitting: Cardiology

## 2014-03-29 ENCOUNTER — Other Ambulatory Visit: Payer: Self-pay | Admitting: Cardiology

## 2014-04-28 ENCOUNTER — Encounter (HOSPITAL_COMMUNITY): Payer: Self-pay | Admitting: Cardiology

## 2014-04-30 ENCOUNTER — Other Ambulatory Visit: Payer: Self-pay | Admitting: Cardiovascular Disease

## 2014-05-02 NOTE — Telephone Encounter (Signed)
Rx refill sent to patient pharmacy   

## 2014-05-05 ENCOUNTER — Telehealth: Payer: Self-pay | Admitting: Cardiovascular Disease

## 2014-05-05 NOTE — Telephone Encounter (Signed)
Closed encounter °

## 2014-05-25 ENCOUNTER — Ambulatory Visit: Payer: BC Managed Care – PPO | Admitting: Cardiovascular Disease

## 2014-06-28 ENCOUNTER — Ambulatory Visit (INDEPENDENT_AMBULATORY_CARE_PROVIDER_SITE_OTHER): Payer: BLUE CROSS/BLUE SHIELD | Admitting: Cardiovascular Disease

## 2014-06-28 ENCOUNTER — Encounter: Payer: Self-pay | Admitting: Cardiovascular Disease

## 2014-06-28 VITALS — BP 102/76 | HR 64 | Ht 69.0 in | Wt 234.0 lb

## 2014-06-28 DIAGNOSIS — R079 Chest pain, unspecified: Secondary | ICD-10-CM

## 2014-06-28 DIAGNOSIS — Z79899 Other long term (current) drug therapy: Secondary | ICD-10-CM

## 2014-06-28 DIAGNOSIS — E785 Hyperlipidemia, unspecified: Secondary | ICD-10-CM

## 2014-06-28 DIAGNOSIS — I480 Paroxysmal atrial fibrillation: Secondary | ICD-10-CM

## 2014-06-28 NOTE — Assessment & Plan Note (Signed)
History of paroxysmal atrial fibrillation maintaining sinus rhythm on flecainide and metoprolol

## 2014-06-28 NOTE — Assessment & Plan Note (Signed)
History of hyperlipidemia/hypertriglyceridemia on fenofibrate. We will recheck a lipid and liver profile

## 2014-06-28 NOTE — Progress Notes (Signed)
06/28/2014 Jimmy [facility]   04/15/1971  [id number]  Primary Physician Jimmy BarreJames John, MD Primary Cardiologist: Jimmy GessJonathan J. 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  His first  episode was at his desk developed a fullness in throat, then burning and pressure in his chest and rapid heart rate. He had a Latte that am and thought that was cause, but he also thought he was having a heart attack. Slowly his heart rate slowed And he felt better.Initial EKG SR without changes, ( previous cath in 2006 with normal coronary arteries). He was seen in the office on 10/31 /14 with chest pain and tachycardia palpitations. He had A. Fib with RVR. [facility] and underwent cardiac catheterization by Dr. Susette [facility] revealing normal noted and normal LV function. He converted to sinus rhythm he was placed on flecainide. [facility] with some palpitations but his father had recently passed away. Since that time he's been thin most part asymptomatic.   Current Outpatient Prescriptions  Medication Sig Dispense Refill  . acetaminophen (TYLENOL) 325 MG tablet Take 2 tablets (650 mg total) by mouth every 4 (four) hours as needed.    . fenofibrate (TRICOR) 48 MG tablet TAKE 1 TABLET (48 MG TOTAL) BY MOUTH DAILY. 30 tablet 10  . flecainide (TAMBOCOR) 50 MG tablet TAKE 1 TABLET (50 MG TOTAL) BY MOUTH EVERY 12 (TWELVE) HOURS. 60 tablet 6  . ibuprofen (ADVIL,MOTRIN) 200 MG tablet Take 600 mg by mouth daily as needed for pain.    . metoprolol tartrate (LOPRESSOR) 25 MG tablet Take 1 tablet (25 mg total) by mouth 2 (two) times daily. 6 tablet 0  . zolpidem (AMBIEN) 5 MG tablet Take 5 mg by mouth at bedtime as needed for sleep.     No current facility-administered medications for this visit.    No Known Allergies  History   Social History  . Marital Status:  Married    Spouse Name: N/A    Number of Children: N/A  . 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 Smoking status: Former Smoker    Quit date: 01/16/2003  . Smokeless tobacco: Never Used  . Alcohol Use: No  . Drug Use: No  . Sexual Activity: Not on file   Other Topics Concern  . Not on file   Social History Narrative     Review of Systems: General: negative for chills, fever, night sweats or weight changes.  Cardiovascular: negative for chest pain, dyspnea on exertion, edema, orthopnea, palpitations, paroxysmal nocturnal dyspnea or shortness of breath Dermatological: negative for rash Respiratory: negative for cough or wheezing Urologic: negative for hematuria Abdominal: negative for nausea, vomiting, diarrhea, bright red blood per rectum, melena, or hematemesis Neurologic: negative for visual changes, syncope, or dizziness All other systems reviewed and are otherwise negative except as noted above.    Blood pressure 102/76, pulse 64, height 5\' 9"  (1.753 m), weight 234 lb (106.142 kg).  General appearance: alert and no distress Neck: no adenopathy, no carotid bruit, no JVD, supple, symmetrical, trachea midline and thyroid not enlarged, symmetric, no tenderness/mass/nodules Lungs: clear to auscultation bilaterally Heart: regular rate and rhythm, S1, S2 normal, no murmur, click, rub or gallop Extremities: extremities normal, atraumatic, no cyanosis or edema  EKG not performed today  ASSESSMENT AND PLAN:   PAF, symptomatic- Flecainide initiated 03/19/13 History of paroxysmal atrial fibrillation maintaining sinus rhythm on flecainide and metoprolol   Hyperlipidemia History of hyperlipidemia/hypertriglyceridemia on fenofibrate. We will recheck a lipid and liver profile   Chest pain with normal coronary angiography 2006 and 03/19/13 History of normal coronary arteries by cardiac catheterization performed by Dr. Herbie [facility]  03/19/13.       Jimmy Gess MD FACP,FACC,FAHA, [facility] 06/28/2014 10:50 AM

## 2014-06-28 NOTE — Patient Instructions (Signed)
Dr. Allyson SabalBerry has ordered for you to have lab work done in the next few days and you must be FASTING.  Your physician wants you to follow-up in 1 year with Dr. Allyson SabalBerry. You will receive a reminder letter in the mail 2 months in advance. If you do not receive a letter, please call our office to schedule the follow-up appointment.

## 2014-06-28 NOTE — Assessment & Plan Note (Signed)
History of normal coronary arteries by cardiac catheterization performed by Dr. Herbie BaltimoreHarding 03/19/13.

## 2014-07-25 ENCOUNTER — Other Ambulatory Visit: Payer: Self-pay | Admitting: Cardiovascular Disease

## 2014-08-01 ENCOUNTER — Other Ambulatory Visit: Payer: Self-pay | Admitting: Orthopaedic Surgery

## 2014-08-01 DIAGNOSIS — M542 Cervicalgia: Secondary | ICD-10-CM

## 2014-08-04 ENCOUNTER — Ambulatory Visit
Admission: RE | Admit: 2014-08-04 | Discharge: 2014-08-04 | Disposition: A | Discharge status: Home / Self Care | Payer: BLUE CROSS/BLUE SHIELD | Source: Ambulatory Visit | Attending: Orthopaedic Surgery | Admitting: Orthopaedic Surgery

## 2014-08-04 DIAGNOSIS — M542 Cervicalgia: Secondary | ICD-10-CM

## 2014-08-07 ENCOUNTER — Other Ambulatory Visit: Payer: BLUE CROSS/BLUE SHIELD

## 2014-10-26 ENCOUNTER — Telehealth: Payer: Self-pay | Admitting: Cardiovascular Disease

## 2014-10-26 NOTE — Telephone Encounter (Signed)
Pt explains he accidentally took dose of his daughter's anxiety medication instead of his fenofibrate yesterday d/t pills looking exactly the same. Realized his mistake.  This was corrected today, notes he took his regular meds at prescribed doses but feels  "a little jittery". Advised to monitor HR, BP - if rapid HR, and BP adequate, OK to take 1 extra dose of metoprolol to see if it helps his HR.  Advised him to call in AM if still having problems. Pt voiced understanding.

## 2014-10-26 NOTE — Telephone Encounter (Signed)
Please call, pt have been taking his medicine wrong. He have been taking 4 Metoprolol a day ,been taking his daughter anxiety medicine by mistake.Pt have not been taking his Flecainide for the last 3 days.Please call asap to advise.

## 2014-10-31 ENCOUNTER — Other Ambulatory Visit: Payer: Self-pay | Admitting: Cardiology

## 2014-10-31 NOTE — Telephone Encounter (Signed)
E SENT TO PHARMACY 

## 2014-11-22 ENCOUNTER — Encounter: Payer: Self-pay | Admitting: Cardiovascular Disease

## 2014-11-22 ENCOUNTER — Ambulatory Visit (INDEPENDENT_AMBULATORY_CARE_PROVIDER_SITE_OTHER): Payer: BLUE CROSS/BLUE SHIELD | Admitting: Cardiovascular Disease

## 2014-11-22 VITALS — BP 132/70 | HR 67 | Ht 69.0 in | Wt 229.5 lb

## 2014-11-22 DIAGNOSIS — Z79899 Other long term (current) drug therapy: Secondary | ICD-10-CM | POA: Diagnosis not present

## 2014-11-22 DIAGNOSIS — E785 Hyperlipidemia, unspecified: Secondary | ICD-10-CM

## 2014-11-22 DIAGNOSIS — I480 Paroxysmal atrial fibrillation: Secondary | ICD-10-CM

## 2014-11-22 NOTE — Patient Instructions (Signed)
  We will see you back in follow up in 1 year with Dr Berry.  Dr Berry has ordered: A FASTING lipid profile: to be done at your convenience.  There is a [facility] lab on the first floor of this building, [address].  They are open from 8am-5pm with a lunch from 12-2.  You do not need an appointment.      

## 2014-11-22 NOTE — Assessment & Plan Note (Signed)
History of PAF with his last documented episode being October 2014. He is on flecainide. He had an episode this past weekend when preparing for a wedding. He does say that he drank sweet tea containing caffeine which is unusual for him. He had discomfort in his chest with radiation to his arm which slowly subsided over hours. He does have a history of normal coronary arteries documented angiographically in 2006 and again in October 2014. I suspect he had an episode of PAF initiated by caffeine intake with RVR and chest pain related to this.

## 2014-11-22 NOTE — Assessment & Plan Note (Signed)
History of chest pain in the past with angiographically documented normal coronary arteries 2006 and again in October 2014.

## 2014-11-22 NOTE — Progress Notes (Signed)
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 He had a Latte that am and thought that was cause, but he also thought he was having a heart attack. Slowly his heart rate slowed And he felt better.Initial EKG SR without changes, ( previous cath in 2006 with normal coronary arteries). For He was seen in the office on 10/31 /14 with chest pain and tachycardia palpitations. He had A. Fib with RVR. He was admitted to the hospital and underwent cardiac catheterization by Dr. Susette Baldwin revealing normal noted and normal LV function. He converted to sinus rhythm and he was placed on flecainide. He saw Jimmy Baldwin [facility] in the office 01/19/14 with some palpitations but his father had recently passed away. Since that time he's been thin most part asymptomatic until this past weekend when he developed chest pain after taking sweet tea. The pain gradually subsided. I suspect that he went into A. Fib with RVR which slowly broke.   Current Outpatient Prescriptions  Medication Sig Dispense Refill  . acetaminophen (TYLENOL) 325 MG tablet Take 2 tablets (650 mg total) by mouth every 4 (four) hours as needed.    . fenofibrate (TRICOR) 48 MG tablet TAKE 1 TABLET (48 MG TOTAL) BY MOUTH DAILY. 30 tablet 10  . flecainide (TAMBOCOR) 50 MG tablet TAKE 1 TABLET (50 MG TOTAL) BY MOUTH EVERY 12 (TWELVE) HOURS. 60 tablet 6  . ibuprofen (ADVIL,MOTRIN) 200 MG tablet Take 600 mg by mouth daily as needed for pain.    . metoprolol tartrate (LOPRESSOR) 25 MG tablet TAKE 1 TABLET (25 MG TOTAL) BY MOUTH 2 (TWO) TIMES DAILY. 60 tablet 11  . zolpidem (AMBIEN) 5 MG tablet Take 5 mg by  mouth at bedtime as needed for sleep.    Marland Kitchen ketoconazole (NIZORAL) 2 % cream Apply 1 application topically daily as needed.  11  . metroNIDAZOLE (METROGEL) 0.75 % gel Apply 1 application topically daily as needed.  11   No current facility-administered medications for this visit.    No Known Allergies  History   Social History  . Marital Status: Married    Spouse Name: N/A  . Number of Children: N/A  . 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 Smoking status: Former Smoker    Quit date: 01/16/2003  . Smokeless tobacco: Never Used  . Alcohol Use: No  . Drug Use: No  . Sexual Activity: Not on file   Other Topics Concern  . Not on file   Social History Narrative     Review of Systems: General: negative for chills, fever, night sweats or weight changes.  Cardiovascular: negative for chest pain, dyspnea on exertion, edema, orthopnea, palpitations, paroxysmal nocturnal dyspnea or shortness of breath Dermatological: negative for rash Respiratory: negative for cough or wheezing Urologic: negative for hematuria Abdominal: negative for nausea, vomiting, diarrhea, bright red blood per rectum, melena, or hematemesis Neurologic: negative for visual changes, syncope, or dizziness All other systems reviewed and are otherwise negative except as noted above.    Blood pressure 132/70, pulse 67, height  (  1.753 m), weight 229 lb 8 oz (104.101 kg).  General appearance: alert and no distress Neck: no adenopathy, no carotid bruit, no JVD, supple, symmetrical, trachea midline and thyroid not enlarged, symmetric, no tenderness/mass/nodules Lungs: clear to auscultation bilaterally Heart: regular rate and rhythm, S1, S2 normal, no murmur, click, rub or gallop Extremities: extremities normal, atraumatic, no cyanosis or edema  EKG normal sinus rhythm at 67 without ST or T-wave changes. I personally reviewed this EKG  ASSESSMENT AND PLAN:    PAF, symptomatic- Flecainide initiated 03/19/13 History of PAF with his last documented episode being October 2014. He is on flecainide. He had an episode this past weekend when preparing for a wedding. He does say that he drank sweet tea containing caffeine which is unusual for him. He had discomfort in his chest with radiation to his arm which slowly subsided over hours. He does have a history of normal coronary arteries documented angiographically in 2006 and again in October 2014. I suspect he had an episode of PAF initiated by caffeine intake with RVR and chest pain related to this.  Hyperlipidemia History of hyperlipidemia on fenofibrate. It has been 2 years since his last lipid profile which we will repeat  Chest pain with normal coronary angiography 2006 and 03/19/13 History of chest pain in the past with angiographically documented normal coronary arteries 2006 and again in October 2014.      Jimmy GessJonathan J. Janiylah Hannis MD FACP,FACC,FAHA, [facility] LPFSCAI 11/22/2014 3:11 PM

## 2014-11-22 NOTE — Assessment & Plan Note (Addendum)
History of hyperlipidemia on fenofibrate. It has been 2 years since his last lipid profile which we will repeat

## 2015-04-04 ENCOUNTER — Other Ambulatory Visit: Payer: Self-pay | Admitting: Cardiovascular Disease

## 2015-04-05 NOTE — Telephone Encounter (Signed)
Rx(s) sent to pharmacy electronically.  

## 2015-06-12 ENCOUNTER — Other Ambulatory Visit: Payer: Self-pay | Admitting: Cardiovascular Disease

## 2015-06-12 NOTE — Telephone Encounter (Signed)
Rx request sent to pharmacy.  

## 2015-06-16 ENCOUNTER — Telehealth: Payer: Self-pay | Admitting: *Deleted

## 2015-06-16 NOTE — Telephone Encounter (Signed)
Called pt pharmacy they stated their is no problem with the Flecainide it cost $15 and pt pick up refill on 06/14/15.   Called pt and it is Fenofibrate that he has a problem with not the Flecainide.  Insurance prefers at 54 mg need PA for 48 mg tablet.  [facility] PA: [phone] #[id number]  Prior Auth. Started on pt for 48 mg tablets of Fenofibrate. They will fax accept/denial on Dr. Isidore Moos in next 48-72 hours.  Pt aware we are working on Prior Auth. And will be in touch with him once we hear something.

## 2015-06-16 NOTE — Telephone Encounter (Signed)
Patient called and stated that he has been without flecainide for several days, as his insurance is not covering it this year. He says that [facility] informed him that they had faxed some paperwork over to our office but have not heard anything yet. Please advise. Thanks, MI

## 2015-06-23 DIAGNOSIS — M67432 Ganglion, left wrist: Secondary | ICD-10-CM | POA: Insufficient documentation

## 2015-06-23 MED ORDER — FENOFIBRATE 54 MG PO TABS: 54.0000 mg | ORAL_TABLET | Freq: Every day | ORAL | Status: DC

## 2015-06-23 NOTE — Telephone Encounter (Signed)
Pt aware of Fenofibrate dosage change to 54 mg, okay per PharmD.  Pt update Rx sent to new preferred pharmacy [facility].

## 2015-07-09 ENCOUNTER — Other Ambulatory Visit: Payer: Self-pay | Admitting: Cardiovascular Disease

## 2015-07-10 NOTE — Telephone Encounter (Signed)
Rx(s) sent to pharmacy electronically.  

## 2016-01-03 ENCOUNTER — Telehealth: Payer: Self-pay | Admitting: Emergency Medicine

## 2016-01-03 NOTE — Telephone Encounter (Signed)
Very sorry, we are unable to honor this request for a controlled substance who is no longer an established pt, as the last visit was aug 2014

## 2016-01-03 NOTE — Telephone Encounter (Signed)
Pt wants to know if he can get a refill on zolpidem (AMBIEN) 5 MG tablet. Pharmacy is [facility] [address] Please advise thanks.

## 2016-01-03 NOTE — Telephone Encounter (Signed)
Patient called back and stated he is going out of town tomorrow and was hoping to get this refill ASAP. Just wanted to make you aware. Thanks.

## 2016-01-04 NOTE — Telephone Encounter (Signed)
Called pt back and offered to make an appt. He said he would call back after he got back from out of town. Tahnks.

## 2016-01-06 ENCOUNTER — Other Ambulatory Visit: Payer: Self-pay | Admitting: Cardiovascular Disease

## 2016-01-10 NOTE — Telephone Encounter (Signed)
Ok with me 

## 2016-01-10 NOTE — Telephone Encounter (Signed)
Pt has called back to schedule a new pt appt. Since pt has not been seem since Aug 2014 he would have to reest care. Are you willing to still except him as a pt? Thanks.

## 2016-01-11 ENCOUNTER — Ambulatory Visit (INDEPENDENT_AMBULATORY_CARE_PROVIDER_SITE_OTHER): Payer: 59 | Admitting: Internal Medicine

## 2016-01-11 ENCOUNTER — Encounter: Payer: Self-pay | Admitting: Internal Medicine

## 2016-01-11 ENCOUNTER — Other Ambulatory Visit (INDEPENDENT_AMBULATORY_CARE_PROVIDER_SITE_OTHER): Payer: 59

## 2016-01-11 VITALS — BP 120/78 | HR 69 | Resp 20 | Wt 244.0 lb

## 2016-01-11 DIAGNOSIS — M25561 Pain in right knee: Secondary | ICD-10-CM | POA: Diagnosis not present

## 2016-01-11 DIAGNOSIS — M545 Low back pain, unspecified: Secondary | ICD-10-CM

## 2016-01-11 DIAGNOSIS — G4700 Insomnia, unspecified: Secondary | ICD-10-CM | POA: Insufficient documentation

## 2016-01-11 DIAGNOSIS — Z0001 Encounter for general adult medical examination with abnormal findings: Secondary | ICD-10-CM

## 2016-01-11 DIAGNOSIS — R7989 Other specified abnormal findings of blood chemistry: Secondary | ICD-10-CM

## 2016-01-11 DIAGNOSIS — M5136 Other intervertebral disc degeneration, lumbar region: Secondary | ICD-10-CM | POA: Insufficient documentation

## 2016-01-11 DIAGNOSIS — R6889 Other general symptoms and signs: Secondary | ICD-10-CM | POA: Diagnosis not present

## 2016-01-11 HISTORY — DX: Insomnia, unspecified: G47.00

## 2016-01-11 LAB — URINALYSIS, ROUTINE W REFLEX MICROSCOPIC
Bilirubin Urine: NEGATIVE
Hgb urine dipstick: NEGATIVE
Ketones, ur: NEGATIVE
Leukocytes, UA: NEGATIVE
Nitrite: NEGATIVE
RBC / HPF: NONE SEEN (ref 0–?)
Specific Gravity, Urine: 1.025 (ref 1.000–1.030)
Total Protein, Urine: NEGATIVE
Urine Glucose: NEGATIVE
Urobilinogen, UA: 1 (ref 0.0–1.0)
pH: 6 (ref 5.0–8.0)

## 2016-01-11 LAB — CBC WITH DIFFERENTIAL/PLATELET
Basophils Absolute: 0 10*3/uL (ref 0.0–0.1)
Basophils Relative: 0.3 % (ref 0.0–3.0)
Eosinophils Absolute: 0.3 10*3/uL (ref 0.0–0.7)
Eosinophils Relative: 3.9 % (ref 0.0–5.0)
HEMATOCRIT: 40.3 % (ref 39.0–52.0)
HEMOGLOBIN: 13.9 g/dL (ref 13.0–17.0)
Lymphocytes Relative: 24.7 % (ref 12.0–46.0)
Lymphs Abs: 2 10*3/uL (ref 0.7–4.0)
MCHC: 34.4 g/dL (ref 30.0–36.0)
MCV: 92.7 fl (ref 78.0–100.0)
Monocytes Absolute: 0.8 10*3/uL (ref 0.1–1.0)
Monocytes Relative: 9.8 % (ref 3.0–12.0)
Neutro Abs: 4.9 10*3/uL (ref 1.4–7.7)
Neutrophils Relative %: 61.3 % (ref 43.0–77.0)
Platelets: 218 10*3/uL (ref 150.0–400.0)
RBC: 4.35 Mil/uL (ref 4.22–5.81)
RDW: 12.3 % (ref 11.5–15.5)
WBC: 8 10*3/uL (ref 4.0–10.5)

## 2016-01-11 MED ORDER — ZOLPIDEM TARTRATE 10 MG PO TABS: 10.0000 mg | ORAL_TABLET | Freq: Every evening | ORAL | 1 refills | Status: DC | PRN

## 2016-01-11 MED ORDER — DICLOFENAC SODIUM 1 % TD GEL: 4.0000 g | Freq: Four times a day (QID) | TRANSDERMAL | 11 refills | Status: DC | PRN

## 2016-01-11 NOTE — Patient Instructions (Signed)
Please take all new medication as prescribed  - the voltaren gel as needed (sent by computer)  Please consider making an appt with Dr Smith/sport medicine in this office if the knee does not improved  Please continue all other medications as before, and refills have been done if requeste - the Remus Lofflerambien (done in hardcopy)  Please have the pharmacy call with any other refills you may need.  Please continue your efforts at being more active, low cholesterol diet, and weight control.  You are otherwise up to date with prevention measures today.  Please keep your appointments with your specialists as you may have planned  Please go to the LAB in the Basement (turn left off the elevator) for the tests to be done today  You will be contacted by phone if any changes need to be made immediately.  Otherwise, you will receive a letter about your results with an explanation, but please check with MyChart first.  Please remember to sign up for MyChart if you have not done so, as this will be important to you in the future with finding out test results, communicating by private email, and scheduling acute appointments online when needed.  Please return in 1 year for your yearly visit, or sooner if needed, with Lab testing done 3-5 days before

## 2016-01-11 NOTE — Assessment & Plan Note (Signed)
Likely underlying DJD, for volt gel prn,  to f/u any worsening symptoms or concerns  In addition to the time spent performing CPE, I spent an additional 15 minutes face to face,in which greater than 50% of this time was spent in counseling and coordination of care for patient's illness as documented.

## 2016-01-11 NOTE — Assessment & Plan Note (Signed)
Chronic stable, for f/u ortho as planned

## 2016-01-11 NOTE — Assessment & Plan Note (Signed)

## 2016-01-11 NOTE — Assessment & Plan Note (Signed)
Mild persistent, for ambien qhs prn,  to f/u any worsening symptoms or concerns

## 2016-01-11 NOTE — Progress Notes (Signed)
Pre visit review using our clinic review tool, if applicable. No additional management support is needed unless otherwise documented below in the visit note. 

## 2016-01-11 NOTE — Progress Notes (Signed)
Subjective:    Patient ID: Jimmy Baldwin, male    DOB: 11/06/1970, 45 y.o.   MRN: [id number]  HPI  Here for wellness and re-establish after lost to f/u;  Overall doing ok;  Pt denies Chest pain, worsening SOB, DOE, wheezing, orthopnea, PND, worsening LE edema, palpitations, dizziness or syncope.  Pt denies neurological change such as new headache, facial or extremity weakness.  Pt denies polydipsia, polyuria, or low sugar symptoms. Pt states overall good compliance with treatment and medications, good tolerability, and has been trying to follow appropriate diet.  Pt denies worsening depressive symptoms, suicidal ideation or panic. No fever, night sweats, wt loss, loss of appetite, or other constitutional symptoms.  Pt states good ability with ADL's, has low fall risk, home safety reviewed and adequate, no other significant changes in hearing or vision, and only occasionally active with exercise.  States BB seemed to slow him down, gained 25 lbs Wt Readings from Last 3 Encounters:  01/11/16 244 lb (110.7 kg)  11/22/14 229 lb 8 oz (104.1 kg)  06/28/14 234 lb (106.1 kg)   Has ongoing pain to neck and lower back with known disc disease.  Pt continues to have recurring LBP without change in severity, bowel or bladder change, fever, wt loss,  worsening LE pain/numbness/weakness, gait change or falls.  But still having difficulty getting to sleeep due to pain, ambien10 mg works better than 5 mg to stay asleep.   Past Medical History:  Diagnosis Date  . Cervical disc disease    per MRI july 2011  . DJD (degenerative joint disease)   . GERD (gastroesophageal reflux disease)    occ tums  . History of stress test 03-07-2011   EF 56% Normal pattern of perfusion in all regions, low risk scan compared to previous study  . Hyperlipidemia 06/20/2011  . PONV (postoperative nausea and vomiting)    Past Surgical History:  Procedure Laterality Date  . CARDIAC CATHETERIZATION  06-01-2004   normal cath;  chest pain considered to be noncardiac  . CARPAL TUNNEL RELEASE  01/17/2012   Procedure: CARPAL TUNNEL RELEASE;  Surgeon: Nicki ReaperGary R Kuzma, MD;  Location: [facility];  Service: Orthopedics;  Laterality: Left;  left carpal tunnel release  . CARPAL TUNNEL RELEASE  02/21/2012   Procedure: CARPAL TUNNEL RELEASE;  Surgeon: Nicki ReaperGary R Kuzma, MD;  Location: [facility];  Service: Orthopedics;  Laterality: Right;  . KNEE ARTHROSCOPY     lt   . LEFT HEART CATHETERIZATION WITH CORONARY ANGIOGRAM N/A 03/19/2013   Procedure: LEFT HEART CATHETERIZATION WITH CORONARY ANGIOGRAM;  Surgeon: Marykay Lexavid W Harding, MD;  [facility];  Service: Cardiovascular;  Laterality: N/A;  . left shoulder surgury     x 3  . right knee surgury      x 4  - twice with ACL tear  . right shoudler surgury     x 1    reports that he quit smoking about 12 years ago. He has never used smokeless tobacco. He reports that he does not drink alcohol or use drugs. family history includes Diabetes in his other; Healthy in his sister and sister; Heart attack (age of onset: 3855) in his father; Heart disease in his mother and other; Hypertension in his other. No Known Allergies Current Outpatient Prescriptions on File Prior to Visit  Medication Sig Dispense Refill  . acetaminophen (TYLENOL) 325 MG tablet Take 2 tablets (650 mg total) by mouth every 4 (four) hours as  needed.    . fenofibrate 54 MG tablet Take 1 tablet (54 mg total) by mouth daily. 90 tablet 3  . flecainide (TAMBOCOR) 50 MG tablet TAKE 1 TABLET (50 MG TOTAL) BY MOUTH EVERY 12 (TWELVE) HOURS. 60 tablet 1  . ibuprofen (ADVIL,MOTRIN) 200 MG tablet Take 600 mg by mouth daily as needed for pain.    Marland Kitchen ketoconazole (NIZORAL) 2 % cream Apply 1 application topically daily as needed.  11  . metoprolol tartrate (LOPRESSOR) 25 MG tablet Take 1 tablet (25 mg total) by mouth 2 (two) times daily. 60 tablet 5  . metroNIDAZOLE (METROGEL) 0.75 % gel Apply 1 application  topically daily as needed.  11   No current facility-administered medications on file prior to visit.    Review of Systems Constitutional: Negative for increased diaphoresis, or other activity, appetite or siginficant weight change other than noted HENT: Negative for worsening hearing loss, ear pain, facial swelling, mouth sores and neck stiffness.   Eyes: Negative for other worsening pain, redness or visual disturbance.  Respiratory: Negative for choking or stridor Cardiovascular: Negative for other chest pain and palpitations.  Gastrointestinal: Negative for worsening diarrhea, blood in stool, or abdominal distention Genitourinary: Negative for hematuria, flank pain or change in urine volume.  Musculoskeletal: Negative for myalgias or other joint complaints.  Skin: Negative for other color change and wound or drainage.  Neurological: Negative for syncope and numbness. other than noted Hematological: Negative for adenopathy. or other swelling Psychiatric/Behavioral: Negative for hallucinations, SI, self-injury, decreased concentration or other worsening agitation.      Objective:   Physical Exam BP 120/78   Pulse 69   Resp 20   Wt 244 lb (110.7 kg)   SpO2 98%   BMI 36.03 kg/m  VS noted,  Constitutional: Pt is oriented to person, place, and time. Appears well-developed and well-nourished, in no significant distress Head: Normocephalic and atraumatic  Eyes: Conjunctivae and EOM are normal. Pupils are equal, round, and reactive to light Right Ear: External ear normal.  Left Ear: External ear normal Nose: Nose normal.  Mouth/Throat: Oropharynx is clear and moist  Neck: Normal range of motion. Neck supple. No JVD present. No tracheal deviation present or significant neck LA or mass Cardiovascular: Normal rate, regular rhythm, normal heart sounds and intact distal pulses.   Pulmonary/Chest: Effort normal and breath sounds without rales or wheezing  Abdominal: Soft. Bowel sounds are  normal. NT. No HSM  Musculoskeletal: Normal range of motion. Exhibits no edema Lymphadenopathy: Has no cervical adenopathy.  Neurological: Pt is alert and oriented to person, place, and time. Pt has normal reflexes. No cranial nerve deficit. Motor grossly intact Skin: Skin is warm and dry. No rash noted or new ulcers Psychiatric:  Has normal mood and affect. Behavior is normal.  Right knee with trace effusion, FROM Spine nontender    Assessment & Plan:

## 2016-01-12 ENCOUNTER — Encounter: Payer: Self-pay | Admitting: Internal Medicine

## 2016-01-12 LAB — BASIC METABOLIC PANEL
BUN: 15 mg/dL (ref 6–23)
CO2: 28 mEq/L (ref 19–32)
Calcium: 9.3 mg/dL (ref 8.4–10.5)
Chloride: 104 mEq/L (ref 96–112)
Creatinine, Ser: 1.13 mg/dL (ref 0.40–1.50)
GFR: 74.08 mL/min (ref >60.00)
GLUCOSE: 74 mg/dL (ref 70–99)
Potassium: 4.5 mEq/L (ref 3.5–5.1)
Sodium: 140 mEq/L (ref 135–145)

## 2016-01-12 LAB — HEPATIC FUNCTION PANEL
ALBUMIN: 4.8 g/dL (ref 3.5–5.2)
ALT: 45 U/L (ref 0–53)
AST: 30 U/L (ref 0–37)
Alkaline Phosphatase: 61 U/L (ref 39–117)
Bilirubin, Direct: 0.1 mg/dL (ref 0.0–0.3)
Total Bilirubin: 0.7 mg/dL (ref 0.2–1.2)
Total Protein: 7.6 g/dL (ref 6.0–8.3)

## 2016-01-12 LAB — PSA: PSA: 0.23 ng/mL (ref 0.10–4.00)

## 2016-01-12 LAB — LIPID PANEL
CHOLESTEROL: 215 mg/dL — AB (ref 0–200)
HDL: 40.7 mg/dL (ref >39.00)
NonHDL: 174.66
TRIGLYCERIDES: 214 mg/dL — AB (ref 0.0–149.0)
Total CHOL/HDL Ratio: 5
VLDL: 42.8 mg/dL — AB (ref 0.0–40.0)

## 2016-01-12 LAB — TSH: TSH: 4.53 u[IU]/mL — ABNORMAL HIGH (ref 0.35–4.50)

## 2016-01-12 LAB — LDL CHOLESTEROL, DIRECT: LDL DIRECT: 140 mg/dL

## 2016-01-26 ENCOUNTER — Encounter: Payer: Self-pay | Admitting: Physician Assistant

## 2016-01-29 ENCOUNTER — Encounter: Payer: Self-pay | Admitting: Physician Assistant

## 2016-01-29 ENCOUNTER — Ambulatory Visit (INDEPENDENT_AMBULATORY_CARE_PROVIDER_SITE_OTHER): Payer: 59 | Admitting: Physician Assistant

## 2016-01-29 VITALS — BP 112/75 | HR 63 | Ht 69.0 in | Wt 243.0 lb

## 2016-01-29 DIAGNOSIS — Z8349 Family history of other endocrine, nutritional and metabolic diseases: Secondary | ICD-10-CM

## 2016-01-29 DIAGNOSIS — E785 Hyperlipidemia, unspecified: Secondary | ICD-10-CM

## 2016-01-29 DIAGNOSIS — I480 Paroxysmal atrial fibrillation: Secondary | ICD-10-CM | POA: Diagnosis not present

## 2016-01-29 MED ORDER — PRAVASTATIN SODIUM 20 MG PO TABS: 20.0000 mg | ORAL_TABLET | Freq: Every evening | ORAL | 3 refills | Status: DC

## 2016-01-29 NOTE — Patient Instructions (Signed)
Medications:  START Pravastatin 20 mg 1 tab once a week. Then increase to 1 tab daily as tolerated.   Labwork:  Your physician recommends that you return for a FASTING lipid profile and a CMET in: 3 months.   Follow-Up:  Your physician recommends that you schedule a follow-up appointment after your labwork--3 months.  If you need a refill on your cardiac medications before your next appointment, please call your pharmacy.

## 2016-01-29 NOTE — Progress Notes (Signed)
Cardiology Office Note   Date:  01/29/2016   ID:  Jimmy Baldwin, DOB [birth year], MRN [id number]  PCP:  Jimmy Barre, MD  Cardiologist:  Dr Jimmy Hamburger, PA-C   Chief Complaint  Patient presents with  . Follow-up    Pt states feeling like he has alot of fluid in him     History of Present Illness: Jimmy Baldwin is a 45 y.o. male with a history of HLD, FH CAD, nl cath 2014, DJD, GERD  Jimmy Baldwin presents for yearly Evaluation and management of his cardiac issues.  Mr. Jimmy Baldwin has several concerns.  #1 weight gain: He has gained some weight in the last couple of years, and wonders if the metoprolol is contributing to this. He has never known himself to have a low heart rate or low blood pressure. He never has presyncope. 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 He is playing softball a couple of times a week but not necessarily exercising regularly other than that. He is working as a Education officer, environmental.  #2 hyperlipidemia: A recent lipid profile but Dr. Waylan Baldwin was reviewed. His LDL is elevated at 140. His triglycerides are elevated at 214, which is lower than they have been in the past. His HDL is normal at 40.7. Total cholesterol is 215. Because of his family history of heart disease, he is worried about this. However, he has not tolerated Lipitor or Crestor in the past. He admits that he could make dietary improvements.  #3. Flushing and sweating episodes: He will have episodes were all of a sudden his face will feel hot and flushed. He is not lightheaded or dizzy. He wonders if this is related to his medications. These episodes seem to occur several hours after carbohydrate heavy meals such as pizza. He can think of no other unifying feature.  #4 daytime edema: He gets some swelling in his legs during the day and wonders if this is related to his heart. The edema is mild.  He does not ever get chest pain. He does not feel he  has any new dyspnea on exertion. He has not had any palpitations and is confident that he has not had any more atrial fibrillation. He is compliant with the flecainide and metoprolol.   Past Medical History:  Diagnosis Date  . Cervical disc disease    per MRI july 2011  . DJD (degenerative joint disease)   . GERD (gastroesophageal reflux disease)    occ tums  . History of stress test 03-07-2011   EF 56% Normal pattern of perfusion in all regions, low risk scan compared to previous study  . Hyperlipidemia 06/20/2011  . Insomnia 01/11/2016  . PONV (postoperative nausea and vomiting)     Past Surgical History:  Procedure Laterality Date  . CARDIAC CATHETERIZATION  06-01-2004   normal cath; chest pain considered to be noncardiac  . CARPAL TUNNEL RELEASE  01/17/2012   Procedure: CARPAL TUNNEL RELEASE;  Surgeon: Jimmy Reaper, MD;  Location: [facility];  Service: Orthopedics;  Laterality: Left;  left carpal tunnel release  . CARPAL TUNNEL RELEASE  02/21/2012   Procedure: CARPAL TUNNEL RELEASE;  Surgeon: Jimmy Reaper, MD;  Location: [facility];  Service: Orthopedics;  Laterality: Right;  . KNEE ARTHROSCOPY     lt   . LEFT HEART CATHETERIZATION WITH CORONARY ANGIOGRAM N/A 03/19/2013   Procedure: LEFT HEART  CATHETERIZATION WITH CORONARY ANGIOGRAM;  Surgeon: Jimmy Lex, MD;  Location: [facility] C[facility] LAB;  Service: Cardiovascular;  Laterality: N/A;  . left shoulder surgury     x 3  . right knee surgury      x 4  - twice with ACL tear  . right shoudler surgury     x 1    Current Outpatient Prescriptions  Medication Sig Dispense Refill  . acetaminophen (TYLENOL) 325 MG tablet Take 2 tablets (650 mg total) by mouth every 4 (four) hours as needed.    . diclofenac sodium (VOLTAREN) 1 % GEL Apply 4 g topically 4 (four) times daily as needed. 400 g 11  . fenofibrate 54 MG tablet Take 1 tablet (54 mg total) by mouth daily. 90 tablet 3  . flecainide (TAMBOCOR) 50 MG  tablet TAKE 1 TABLET (50 MG TOTAL) BY MOUTH EVERY 12 (TWELVE) HOURS. 60 tablet 1  . ibuprofen (ADVIL,MOTRIN) 200 MG tablet Take 600 mg by mouth daily as needed for pain.    Marland Kitchen ketoconazole (NIZORAL) 2 % cream Apply 1 application topically daily as needed.  11  . metoprolol tartrate (LOPRESSOR) 25 MG tablet Take 1 tablet (25 mg total) by mouth 2 (two) times daily. 60 tablet 5  . metroNIDAZOLE (METROGEL) 0.75 % gel Apply 1 application topically daily as needed.  11  . zolpidem (AMBIEN) 10 MG tablet Take 1 tablet (10 mg total) by mouth at bedtime as needed for sleep. 90 tablet 1   No current facility-administered medications for this visit.     Allergies:   Review of patient's allergies indicates no known allergies.    Social History:  The patient  reports that he quit smoking about 13 years ago. He has never used smokeless tobacco. He reports that he does not drink alcohol or use drugs.   Family History:  The patient's family history includes Diabetes in his other; Healthy in his sister and sister; Heart attack (age of onset: 39) in his father; Heart disease in his mother and other; Hypertension in his other.    ROS:  Please see the history of present illness. All other systems are reviewed and negative.    PHYSICAL EXAM: VS:  BP 112/75   Pulse 63   Ht 5\' 9"  (1.753 m)   Wt 243 lb (110.2 kg)   BMI 35.88 kg/m  , BMI Body mass index is 35.88 kg/m. GEN: Well nourished, well developed, male in no acute distress  HEENT: normal for age  Neck: no JVD, no carotid bruit, no masses Cardiac: RRR; no murmur, no rubs, or gallops Respiratory:  clear to auscultation bilaterally, normal work of breathing GI: soft, nontender, nondistended, + BS MS: no deformity or atrophy; no edema; distal pulses are 2+ in all 4 extremities   Skin: warm and dry, no rash Neuro:  Strength and sensation are intact Psych: euthymic mood, full affect   EKG:  EKG is ordered today. The ekg ordered today demonstrates  sinus rhythm, rate 63, possible early repolarization changes, no change from 2016   Recent Labs: 01/11/2016: ALT 45; BUN 15; Creatinine, Ser 1.13; Hemoglobin 13.9; Platelets 218.0; Potassium 4.5; Sodium 140; TSH 4.53    Lipid Panel    Component Value Date/Time   CHOL 215 (H) 01/11/2016 1706   TRIG 214.0 (H) 01/11/2016 1706   HDL 40.70 01/11/2016 1706   CHOLHDL 5 01/11/2016 1706   VLDL 42.8 (H) 01/11/2016 1706   LDLCALC 128 (H) 03/20/2013 0448   LDLDIRECT 140.0  01/11/2016 1706     Wt Readings from Last 3 Encounters:  01/29/16 243 lb (110.2 kg)  01/11/16 244 lb (110.7 kg)  11/22/14 229 lb 8 oz (104.1 kg)     Other studies Reviewed: Additional studies/ records that were reviewed today include: Office notes and testing.  ASSESSMENT AND PLAN:  1. Waking: He admits that his activity level is not as good as it used to be. He is active with the softball team but he is not exercising regularly. He is encouraged to get more regular exercise.  2. Hyperlipidemia: He will try to make some dietary changes. He has had problems with statins in the past. He has tried 2. I requested that he try one more. We will start Pravachol once a week and if he tolerates that, he is to gradually increase it until hopefully he is taking it every day. He is to come back for lipid and liver in 3 months. If he wishes an office visit to review the data that is fine otherwise it is okay for us to manage this by phone.  3. Flushing and sweating: There is no medication that he is on that should cause this. It is not happening. Often. If it continues to happen after carb heavy meals, consider checking a hemoglobin A1c. His triglycerides have been elevated, but his blood sugar was actually on the low side of normal when he had his lipids tested. We will check a hemoglobin A1c for screening purposes. If it is abnormal, follow-up with Dr. Jonny RuizJohn.  4. Lower extremity edema: He is getting this daytime only. He has no  varicosities on exam and distal pulses are good. I advised him that he should try to wear socks whenever possible with a good amount of elastic in them. She contact us if he gets any worse but I reassured him that I did not feel it was serious.  Current medicines are reviewed at length with the patient today.  The patient has concerns regarding medicines.  The following changes have been made:  Add Pravachol  Labs/ tests ordered today include:  No orders of the defined types were placed in this encounter.    Disposition:   FU with Dr. Allyson SabalBerry  Signed, Leanna BattlesBarrett, Rhonda, PA-C  01/29/2016 8:18 AM    [facility] Phone: [phone]; Fax: [phone]  This note was written with the assistance of speech recognition software. Please excuse any transcriptional errors.

## 2016-02-06 ENCOUNTER — Other Ambulatory Visit: Payer: Self-pay | Admitting: Cardiovascular Disease

## 2016-02-06 NOTE — Telephone Encounter (Signed)
Attempt to contact patient-no answer, lmtcb.  Rx sent to pharmacy electronically.

## 2016-02-06 NOTE — Telephone Encounter (Signed)
New emssage    Pt c/o medication issue:  1. Name of Medication: pravastatin 20mg   2. How are you currently taking this medication (dosage and times per day)? 1xday      pt takes 1x week  3. Are you having a reaction (difficulty breathing--STAT)? no  4. What is your medication issue? It makes pt joints hurt and he feels like he has the flu every time he takes the medication, he verbalized that he will not take it again

## 2016-03-12 ENCOUNTER — Other Ambulatory Visit: Payer: Self-pay | Admitting: Cardiovascular Disease

## 2016-03-12 NOTE — Telephone Encounter (Signed)
Rx(s) sent to pharmacy electronically.  

## 2016-05-14 ENCOUNTER — Telehealth: Payer: Self-pay | Admitting: *Deleted

## 2016-05-14 MED ORDER — ZOLPIDEM TARTRATE 10 MG PO TABS: 10.0000 mg | ORAL_TABLET | Freq: Every evening | ORAL | 1 refills | Status: DC | PRN

## 2016-05-14 NOTE — Telephone Encounter (Signed)
Rec'd call pt requesting refill on [facility] [address]...Raechel Chute/lmb

## 2016-05-14 NOTE — Telephone Encounter (Signed)
[facility] 

## 2016-05-14 NOTE — Telephone Encounter (Signed)
Done hardcopy to Corinne  

## 2016-06-25 ENCOUNTER — Other Ambulatory Visit: Payer: Self-pay | Admitting: Cardiovascular Disease

## 2016-06-26 NOTE — Telephone Encounter (Signed)
Rx(s) sent to pharmacy electronically.  

## 2016-07-29 ENCOUNTER — Telehealth: Payer: Self-pay | Admitting: Cardiovascular Disease

## 2016-07-29 NOTE — Telephone Encounter (Signed)
Returned call to patient. He is trying to get Ciprofloxacin for traveler's diarrhea for when he goes to [place]. He states there is an interaction w/one of his medications. He is leaving for [place] 3[place]19  Will route to pharmacy staff for recommendation

## 2016-07-29 NOTE — Telephone Encounter (Signed)
Mr.Coen is calling because he is going to [place] and is trying to get Cipro, but it conflicts with the medication in which she is on and is wanting to know what can he take . Please call

## 2016-07-30 ENCOUNTER — Other Ambulatory Visit: Payer: Self-pay | Admitting: Cardiovascular Disease

## 2016-07-30 NOTE — Telephone Encounter (Signed)
Rx(s) sent to pharmacy electronically.  

## 2016-07-31 MED ORDER — SULFAMETHOXAZOLE-TRIMETHOPRIM 800-160 MG PO TABS: 1.0000 | ORAL_TABLET | Freq: Two times a day (BID) | ORAL | 0 refills | Status: DC

## 2016-07-31 NOTE — Telephone Encounter (Signed)
Discussed therapeutic options with Dr Luciana Axeomer from the [facility].   1st line recommendations include ciprofloxacin and azithromycin but they represent drug-drug interactions with flecainide due to potential Qtc prolongation.  **Best option per ID (Dr Luciana Axeomer) is  Bactrim DS twice daily for 3 days.  Use only if significant diarrhea present.  Patient to visit local facility in [place] for Ceftriaxone IV therapy if traveler's diarreha not resolved after 3 days of Bactrim**   Patient aware of recommendations and  Rx sent to prefer pharmacy.

## 2016-08-23 ENCOUNTER — Other Ambulatory Visit: Payer: Self-pay | Admitting: Cardiovascular Disease

## 2016-08-23 NOTE — Telephone Encounter (Signed)
REFILL 

## 2016-09-02 ENCOUNTER — Encounter: Payer: Self-pay | Admitting: Physician Assistant

## 2016-09-02 ENCOUNTER — Ambulatory Visit (INDEPENDENT_AMBULATORY_CARE_PROVIDER_SITE_OTHER): Payer: 59 | Admitting: Physician Assistant

## 2016-09-02 VITALS — BP 127/81 | HR 62 | Ht 69.0 in | Wt 252.4 lb

## 2016-09-02 DIAGNOSIS — R600 Localized edema: Secondary | ICD-10-CM

## 2016-09-02 DIAGNOSIS — E78019 Familial hypercholesterolemia, unspecified: Secondary | ICD-10-CM

## 2016-09-02 DIAGNOSIS — E7801 Familial hypercholesterolemia: Secondary | ICD-10-CM

## 2016-09-02 DIAGNOSIS — Z791 Long term (current) use of non-steroidal anti-inflammatories (NSAID): Secondary | ICD-10-CM | POA: Diagnosis not present

## 2016-09-02 DIAGNOSIS — I480 Paroxysmal atrial fibrillation: Secondary | ICD-10-CM

## 2016-09-02 LAB — COMPREHENSIVE METABOLIC PANEL
ALK PHOS: 63 U/L (ref 40–115)
ALT: 49 U/L — ABNORMAL HIGH (ref 9–46)
AST: 27 U/L (ref 10–40)
Albumin: 4.3 g/dL (ref 3.6–5.1)
BUN: 11 mg/dL (ref 7–25)
CALCIUM: 9.4 mg/dL (ref 8.6–10.3)
CO2: 28 mmol/L (ref 20–31)
Chloride: 104 mmol/L (ref 98–110)
Creat: 0.82 mg/dL (ref 0.60–1.35)
Glucose, Bld: 133 mg/dL — ABNORMAL HIGH (ref 65–99)
POTASSIUM: 4.5 mmol/L (ref 3.5–5.3)
Sodium: 139 mmol/L (ref 135–146)
TOTAL PROTEIN: 7.2 g/dL (ref 6.1–8.1)
Total Bilirubin: 0.6 mg/dL (ref 0.2–1.2)

## 2016-09-02 MED ORDER — POTASSIUM CHLORIDE ER 10 MEQ PO TBCR: 10.0000 meq | EXTENDED_RELEASE_TABLET | Freq: Every day | ORAL | 3 refills | Status: DC

## 2016-09-02 MED ORDER — FUROSEMIDE 20 MG PO TABS: 10.0000 mg | ORAL_TABLET | Freq: Every day | ORAL | 3 refills | Status: DC

## 2016-09-02 NOTE — Progress Notes (Signed)
Cardiology Office Note   Date:  09/02/2016   ID:  Jimmy Baldwin, DOB [birth year], MRN [id number]  PCP:  Oliver Barre, MD  Cardiologist:  Dr. Allyson Sabal 11/22/2014  Theodore Demark, PA-C 01/29/2016  Chief Complaint  Patient presents with  . Follow-up    12 months;  . Shortness of Breath    occasionally.  . Leg Pain    cramping in legs    History of Present Illness: Jimmy Baldwin is a 46 y.o. male with a history of HLD (Intolerant to Pravachol, Lipitor and Crestor), FH CAD, nl cath 2014, DJD, GERD  Jimmy Baldwin presents for cardiology evaluation and follow up.  He is taking Aleve most days for his back, and takes ibuprofen 600 mg 5 days/week for headaches. He feels the headaches are from sensitivity to light and perhaps from sinuses. 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  He is still preaching and teaching as well. The trip to [place] went well, they were able to visit missionaries. No one got sick and he did not require antibiotics.  No chest pain with activity. Some LE edema during the day, he does not wake with it. He is compliant with his medications. He denies orthopnea or PND.  He never has palpitations. He is compliant with flecainide and does not think that he has had any episodes of PAF.  He tries to eat a low-cholesterol and low-sodium diet. He admits to occasional dietary indiscretions. He tried taking the Pravachol which was a low dose and only to be started once a week. However, he had serious problems tolerating this and stopped it after only a few doses. Because of his family history of coronary artery disease he is interested in being aggressive with medical therapy for his hyperlipidemia.    Past Medical History:  Diagnosis Date  . Cervical disc disease    per MRI july 2011  . DJD (degenerative joint disease)   . GERD (gastroesophageal reflux disease)    occ tums  . History of stress test  03-07-2011   EF 56% Normal pattern of perfusion in all regions, low risk scan compared to previous study  . Hyperlipidemia 06/20/2011  . Insomnia 01/11/2016  . PONV (postoperative nausea and vomiting)     Past Surgical History:  Procedure Laterality Date  . CARDIAC CATHETERIZATION  06-01-2004   normal cath; chest pain considered to be noncardiac  . CARPAL TUNNEL RELEASE  01/17/2012   Procedure: CARPAL TUNNEL RELEASE;  Surgeon: Nicki Reaper, MD;  Location: [facility];  Service: Orthopedics;  Laterality: Left;  left carpal tunnel release  . CARPAL TUNNEL RELEASE  02/21/2012   Procedure: CARPAL TUNNEL RELEASE;  Surgeon: Nicki Reaper, MD;  Location: [facility];  Service: Orthopedics;  Laterality: Right;  . KNEE ARTHROSCOPY     lt   . LEFT HEART CATHETERIZATION WITH CORONARY ANGIOGRAM N/A 03/19/2013   Procedure: LEFT HEART CATHETERIZATION WITH CORONARY ANGIOGRAM;  Surgeon: Marykay Lex, MD;  Location: [facility];  Service: Cardiovascular;  Laterality: N/A;  . left shoulder surgury     x 3  . right knee surgury      x 4  - twice with ACL tear  . right shoudler surgury     x 1    Current Outpatient Prescriptions  Medication Sig Dispense Refill  . acetaminophen (TYLENOL) 325 MG tablet Take 2 tablets (  650 mg total) by mouth every 4 (four) hours as needed.    . diclofenac sodium (VOLTAREN) 1 % GEL Apply 4 g topically 4 (four) times daily as needed. 400 g 11  . fenofibrate 54 MG tablet Take 1 tablet (54 mg total) by mouth daily. PLEASE CONTACT OFFICE FOR ADDITIONAL REFILLS FINAL WARNING 7 tablet 0  . flecainide (TAMBOCOR) 50 MG tablet TAKE 1 TABLET (50 MG TOTAL) BY MOUTH EVERY 12 (TWELVE) HOURS. 60 tablet 10  . ibuprofen (ADVIL,MOTRIN) 200 MG tablet Take 600 mg by mouth daily as needed for pain.    Marland Kitchen ketoconazole (NIZORAL) 2 % cream Apply 1 application topically daily as needed.  11  . metoprolol tartrate (LOPRESSOR) 25 MG tablet Take 1 tablet (25 mg total) by  mouth 2 (two) times daily. KEEP OV. 60 tablet 0  . metroNIDAZOLE (METROGEL) 0.75 % gel Apply 1 application topically daily as needed.  11  . sulfamethoxazole-trimethoprim (BACTRIM DS,SEPTRA DS) 800-160 MG tablet Take 1 tablet by mouth 2 (two) times daily. Use if significant diarrhea. Visit nearest hospital/clinic if diarrhea not improved in 3 days 6 tablet 0  . zolpidem (AMBIEN) 10 MG tablet Take 1 tablet (10 mg total) by mouth at bedtime as needed for sleep. 90 tablet 1   No current facility-administered medications for this visit.     Allergies:   Crestor [rosuvastatin calcium] and Lipitor [atorvastatin]    Social History:  The patient  reports that he quit smoking about 13 years ago. He has never used smokeless tobacco. He reports that he does not drink alcohol or use drugs.   Family History:  The patient's family history includes Diabetes in his other; Healthy in his sister and sister; Heart attack (age of onset: 38) in his father; Heart disease in his mother and other; Hypertension in his other.  ROS:  Please see the history of present illness. All other systems are reviewed and negative.    PHYSICAL EXAM: VS:  BP 127/81   Pulse 62   Ht  (1.753 m)   Wt 252 lb 6.4 oz (114.5 kg)   BMI 37.27 kg/m  , BMI Body mass index is 37.27 kg/m. GEN: Well nourished, well developed, male in no acute distress  HEENT: normal for age  Neck: no JVD, no carotid bruit, no masses Cardiac: RRR; soft murmur, no rubs, or gallops Respiratory:  clear to auscultation bilaterally, normal work of breathing GI: soft, nontender, nondistended, + BS MS: no deformity or atrophy; no edema; distal pulses are 2+ in all 4 extremities   Skin: warm and dry, no rash Neuro:  Strength and sensation are intact Psych: euthymic mood, full affect   EKG:  EKG is not ordered today.  Recent Labs: 01/11/2016: ALT 45; BUN 15; Creatinine, Ser 1.13; Hemoglobin 13.9; Platelets 218.0; Potassium 4.5; Sodium 140; TSH 4.53     Lipid Panel    Component Value Date/Time   CHOL 215 (H) 01/11/2016 1706   TRIG 214.0 (H) 01/11/2016 1706   HDL 40.70 01/11/2016 1706   CHOLHDL 5 01/11/2016 1706   VLDL 42.8 (H) 01/11/2016 1706   LDLCALC 128 (H) 03/20/2013 0448   LDLDIRECT 140.0 01/11/2016 1706     Wt Readings from Last 3 Encounters:  09/02/16 252 lb 6.4 oz (114.5 kg)  01/29/16 243 lb (110.2 kg)  01/11/16 244 lb (110.7 kg)     Other studies Reviewed: Additional studies/ records that were reviewed today include: Office notes and testing.  ASSESSMENT AND PLAN:  1.  PAF: He is compliant with flecainide. He does not think he has had any episodes of PAF since he was last seen. He is careful about what other medications he uses in conjunction with that and consulted with the pharmacist here prior to getting up when necessary antibiotic prescription before his trip to [place]. This was reviewed by the pharmacist but fortunately was not needed. CHADS2VASC2=0, no anticoagulation.  2. Hyperlipidemia: His LDL is elevated. He feels that he is generally diet compliant. We will make a referral to the lipid clinic to see if he is a candidate for Repatha or one of the other PCSK-9 medications. He is to stop fenofibrate.  3. Lower extremity edema: He will be started on Lasix and potassium to help with this. Encouraged to limit the sodium in his diet.  4. NSAIDs use: He is taking the maximum prescribed dose of Aleve and also taking 600 mg of ibuprofen on a regular basis with the per headaches. He is encouraged to limit the lower extremity edema and we will check a complete metabolic profile today.   Current medicines are reviewed at length with the patient today.  The patient has concerns regarding medicines.  The following changes have been made:  DC Pravachol  Labs/ tests ordered today include:  No orders of the defined types were placed in this encounter.    Disposition:   FU with Dr. Allyson Sabal  Signed, Leanna Battles  09/02/2016 9:14 AM    [facility] Phone: [phone]; Fax: [phone]  This note was written with the assistance of speech recognition software. Please excuse any transcriptional errors.

## 2016-09-02 NOTE — Patient Instructions (Addendum)
Medication Instructions:  STOP FENOFIBRATE START LASIX 10 MG DAILY (1/2 OF  TABLET) START POTASSIUM 10 MEQ DAILY (KDUR)  If you need a refill on your cardiac medications before your next appointment, please call your pharmacy.  Labwork: [facility] AT [facility] ON THE 1ST FLOOR   Follow-Up: Your physician wants you to follow-up in: 6 MONTHS WITH DR BERRY-October 2018 You will receive a reminder letter in the mail two months in advance. If you don't receive a letter, please call our office AUGUST 2018 to schedule the OCTOBER follow-up appointment.   Special Instructions: REFER TO LIPID CLINIC~1 MONTH  FOLLOW UP WITH PCP  PLEASE WEAR HAT AND SUNGLASSES DAILY-WHEN NEEDED   Thank you for choosing [facility] at [facility]!!    RHONDA BARRETT, PA-C Marcelino Duster, LPN

## 2016-09-12 ENCOUNTER — Telehealth: Payer: Self-pay | Admitting: *Deleted

## 2016-09-12 NOTE — Telephone Encounter (Signed)
Rec'd call pt requesting [facility]ill on [facility]mb

## 2016-09-13 ENCOUNTER — Other Ambulatory Visit: Payer: Self-pay | Admitting: Cardiovascular Disease

## 2016-09-13 MED ORDER — ZOLPIDEM TARTRATE 10 MG PO TABS: 10.0000 mg | ORAL_TABLET | Freq: Every evening | ORAL | 1 refills | Status: DC | PRN

## 2016-09-13 NOTE — Telephone Encounter (Signed)
faxed

## 2016-09-13 NOTE — Telephone Encounter (Signed)
Called pt no answer LMOM [facility].../lmb 

## 2016-09-13 NOTE — Telephone Encounter (Signed)
Done hardcopy to Shirron  

## 2016-09-17 ENCOUNTER — Other Ambulatory Visit: Payer: Self-pay | Admitting: *Deleted

## 2016-09-17 MED ORDER — METOPROLOL TARTRATE 25 MG PO TABS: 25.0000 mg | ORAL_TABLET | Freq: Two times a day (BID) | ORAL | 1 refills | Status: DC

## 2016-10-02 ENCOUNTER — Ambulatory Visit (INDEPENDENT_AMBULATORY_CARE_PROVIDER_SITE_OTHER): Payer: 59 | Admitting: Pharmacist

## 2016-10-02 DIAGNOSIS — E7801 Familial hypercholesterolemia: Secondary | ICD-10-CM | POA: Diagnosis not present

## 2016-10-02 LAB — COMPLETE METABOLIC PANEL WITH GFR
ALBUMIN: 4.5 g/dL (ref 3.6–5.1)
ALK PHOS: 78 U/L (ref 40–115)
ALT: 48 U/L — ABNORMAL HIGH (ref 9–46)
AST: 29 U/L (ref 10–40)
BILIRUBIN TOTAL: 0.9 mg/dL (ref 0.2–1.2)
BUN: 13 mg/dL (ref 7–25)
CO2: 26 mmol/L (ref 20–31)
Calcium: 9.6 mg/dL (ref 8.6–10.3)
Chloride: 100 mmol/L (ref 98–110)
Creat: 1.14 mg/dL (ref 0.60–1.35)
GFR, EST NON AFRICAN AMERICAN: 76 mL/min (ref >=60)
GFR, Est African American: 88 mL/min (ref >=60)
GLUCOSE: 112 mg/dL — AB (ref 65–99)
Potassium: 4.3 mmol/L (ref 3.5–5.3)
SODIUM: 138 mmol/L (ref 135–146)
TOTAL PROTEIN: 7.4 g/dL (ref 6.1–8.1)

## 2016-10-02 LAB — LIPID PANEL
CHOL/HDL RATIO: 7.1 ratio — AB (ref <5.0)
CHOLESTEROL: 241 mg/dL — AB (ref <200)
HDL: 34 mg/dL — ABNORMAL LOW (ref >40)
LDL Cholesterol: 164 mg/dL — ABNORMAL HIGH (ref <100)
TRIGLYCERIDES: 215 mg/dL — AB (ref <150)
VLDL: 43 mg/dL — ABNORMAL HIGH (ref <30)

## 2016-10-02 LAB — LDL CHOLESTEROL, DIRECT: Direct LDL: 148 mg/dL — ABNORMAL HIGH (ref <100)

## 2016-10-02 MED ORDER — FENOFIBRATE 54 MG PO TABS: 54.0000 mg | ORAL_TABLET | Freq: Every day | ORAL | 11 refills | Status: DC

## 2016-10-02 MED ORDER — ROSUVASTATIN CALCIUM 5 MG PO TABS: ORAL_TABLET | ORAL | 1 refills | Status: DC

## 2016-10-02 NOTE — Patient Instructions (Addendum)
Lipid Clinic Kymberlie Brazeau/Kristin [phone]  **Start Crestor 5mg  once weekly x 3 weeks, then increase to every Monday and Friday **Resume fenofibrate 54mg  daily **Increase fresh fruits and vegetables in diet **Blood work today    Cholesterol Cholesterol is a fat. Your body needs a small amount of cholesterol. Cholesterol (plaque) may build up in your blood vessels (arteries). That makes you more likely to have a heart attack or stroke. You cannot feel your cholesterol level. Having a blood test is the only way to find out if your level is high. Keep your test results. Work with your doctor to keep your cholesterol at a good level. What do the results mean?  Total cholesterol is how much cholesterol is in your blood.  LDL is bad cholesterol. This is the type that can build up. Try to have low LDL.  HDL is good cholesterol. It cleans your blood vessels and carries LDL away. Try to have high HDL.  Triglycerides are fat that the body can store or burn for energy. What are good levels of cholesterol?  Total cholesterol below 200.  LDL below 100 is good for people who have health risks. LDL below 70 is good for people who have very high risks.  HDL above 40 is good. It is best to have HDL of 60 or higher.  Triglycerides below 150. How can I lower my cholesterol? Diet  Follow your diet program as told by your doctor.  Choose fish, white meat chicken, or [place]key that is roasted or baked. Try not to eat red meat, fried foods, sausage, or lunch meats.  Eat lots of fresh fruits and vegetables.  Choose whole grains, beans, pasta, potatoes, and cereals.  Choose olive oil, corn oil, or canola oil. Only use small amounts.  Try not to eat butter, mayonnaise, shortening, or palm kernel oils.  Try not to eat foods with trans fats.  Choose low-fat or nonfat dairy foods.  Drink skim or nonfat milk.  Eat low-fat or nonfat yogurt and cheeses.  Try not to drink whole milk or cream.  Try  not to eat ice cream, egg yolks, or full-fat cheeses.  Healthy desserts include angel food cake, ginger snaps, animal crackers, hard candy, popsicles, and low-fat or nonfat frozen yogurt. Try not to eat pastries, cakes, pies, and cookies. Exercise  Follow your exercise program as told by your doctor.  Be more active. Try gardening, walking, and taking the stairs.  Ask your doctor about ways that you can be more active. Medicine  Take over-the-counter and prescription medicines only as told by your doctor. This information is not intended to replace advice given to you by your health care provider. Make sure you discuss any questions you have with your health care provider. Document Released: 08/02/2008 Document Revised: 12/06/2015 Document Reviewed: 11/16/2015 Elsevier Interactive Patient Education  2017 [employer]

## 2016-10-02 NOTE — Progress Notes (Signed)
Patient ID: Jimmy Baldwin                 DOB: 09/09/1970                    MRN: [id number]     HPI:  Jimmy Baldwin is a 46 y.o. male patient of Dr Allyson SabalBerry referred to lipid clinic by Theodore Demarkhonda Barrett PA-C.  PMH includes hyperlipidemia, lower extremity edema, and paroxysmal A-Fib.  Patient denies MI, stent placement, xanthomas or arcus cornealis for self or first-degree relative.  Baseline LDLc was 140 on 12/2015 and highest noted was 153 on 2012.  Alsp noted prior history of statin intolerance.   Patient tried pravastatin 20mg , atorvastatin, and rosuvastatin 10mg . All statins caused severe join pain, fever and flu-like symptoms. Never tied Zetia or low dose crestor.  He was previously taking fenofibrate but off medication for > 1 month due no more refills available. Patient presents to clinic for potential PCSK9 inhibitor initiation.    Current Medications: none   Intolerances:  Pravastatin Atorvastatin Rosuvastatin  LDL goal: <100 mg/dL  Diet: no restrictions at this time  Exercise: limited; significant amount of walking work related but not schedule exercise regimen   Family History: no history of first-degree relative with premature coronary or vascular disease.   Social History: former smoker, denies smokeless tobacco, denies alcohol  Labs: CHO 215; TG 214; HDL 40; LDL-D 140 (01/11/2016)  Past Medical History:  Diagnosis Date  . Cervical disc disease    per MRI july 2011  . DJD (degenerative joint disease)   . GERD (gastroesophageal reflux disease)    occ tums  . History of stress test 03-07-2011   EF 56% Normal pattern of perfusion in all regions, low risk scan compared to previous study  . Hyperlipidemia 06/20/2011  . Insomnia 01/11/2016  . PAF (paroxysmal atrial fibrillation) (HCC) 2014  . PONV (postoperative nausea and vomiting)     Current Outpatient Prescriptions on File Prior to Visit  Medication Sig Dispense Refill  . acetaminophen (TYLENOL) 325 MG tablet Take 2  tablets (650 mg total) by mouth every 4 (four) hours as needed.    . diclofenac sodium (VOLTAREN) 1 % GEL Apply 4 g topically 4 (four) times daily as needed. 400 g 11  . flecainide (TAMBOCOR) 50 MG tablet TAKE 1 TABLET (50 MG TOTAL) BY MOUTH EVERY 12 (TWELVE) HOURS. 60 tablet 10  . furosemide (LASIX) 20 MG tablet Take 0.5 tablets (10 mg total) by mouth daily. 90 tablet 3  . ibuprofen (ADVIL,MOTRIN) 200 MG tablet Take 600 mg by mouth daily as needed for pain.    Marland Kitchen. ketoconazole (NIZORAL) 2 % cream Apply 1 application topically daily as needed.  11  . metoprolol tartrate (LOPRESSOR) 25 MG tablet Take 1 tablet (25 mg total) by mouth 2 (two) times daily. 180 tablet 1  . metroNIDAZOLE (METROGEL) 0.75 % gel Apply 1 application topically daily as needed.  11  . potassium chloride (K-DUR) 10 MEQ tablet Take 1 tablet (10 mEq total) by mouth daily. 90 tablet 3  . sulfamethoxazole-trimethoprim (BACTRIM DS,SEPTRA DS) 800-160 MG tablet Take 1 tablet by mouth 2 (two) times daily. Use if significant diarrhea. Visit nearest hospital/clinic if diarrhea not improved in 3 days 6 tablet 0  . zolpidem (AMBIEN) 10 MG tablet Take 1 tablet (10 mg total) by mouth at bedtime as needed for sleep. 90 tablet 1   No current facility-administered medications on file prior to visit.  Allergies  Allergen Reactions  . Crestor [Rosuvastatin Calcium]     Joint/muscle aching  . Lipitor [Atorvastatin]     Joint/muscle aching    Hypercholesterolemia:  LDL and triglycerides significantly elevated but unable to obtained [place] score indicating Familial hypercholesterolemia.  Will be very difficult to obtain pre-approval form insurance but will submit paper for evaluation of primary prevention.  PCSK9i indication, storage, administration technique, and common side effects discussed during office visit.  Will repeat Lipid profile today to send recent data to insurance.    Will resume fenofibrate 54mg  daily, encouraged lifestyle  modifications, and initiate rosuvastatin 5mg  twice a week (rosuvastatin 5mg  weekly for 3 weeks, then increase to twice a week if tolerating).   Potential use of omega-3 instead of fenofibrate was discussed with patient to decrease potential for myalgia side effects , but patient refused omega -3 due to "smell".  If patient is able to tolerate daily fenofibrate plus rosuvastatin twice per week, Lipil panel will be repeated in 3 months.   Ralph Benavidez Rodriguez-Guzman PharmD, BCPS [facility] [address] [place] [address] 10/02/2016 9:03 AM

## 2016-10-03 ENCOUNTER — Encounter: Payer: Self-pay | Admitting: Pharmacist

## 2016-10-04 ENCOUNTER — Telehealth: Payer: Self-pay | Admitting: Cardiovascular Disease

## 2016-10-04 NOTE — Telephone Encounter (Signed)
Follow up    [facility]

## 2016-10-04 NOTE — Telephone Encounter (Signed)
Returned the phone call to the patient. He was informed of his lab results. He stated that he was already working with pharmD.

## 2016-10-09 ENCOUNTER — Telehealth: Payer: Self-pay | Admitting: Pharmacist

## 2016-10-09 NOTE — Telephone Encounter (Signed)
Taking rosuvastatin 5mg  once weekly with fenofibrate 54mg  daily  [place] Diagnostic Criteria  = 1 for 1st degree relative with premature CAD  Will continue to follow

## 2016-10-14 IMAGING — MR MR CERVICAL SPINE W/O CM
4 of 5 series · 19 of 48 positions shown · non-contrast
Comparison: Cervical spine MRI 12/14/2009.

CLINICAL DATA: 44-year-old male with cervical neck pain. Right
upper extremity numbness. Initial encounter.

EXAM:
MRI CERVICAL SPINE WITHOUT CONTRAST
TECHNIQUE: Multiplanar, multisequence MR imaging of the cervical spine was
performed. No intravenous contrast was administered.

[Series 3: T2 · sagittal · 3.0mm · 0.41mm/px · 6 of 13 slices shown (1 of 3)]
[im 1/13]
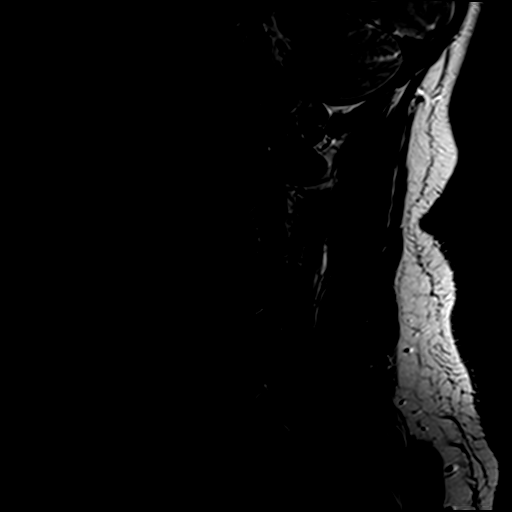
[im 3/13]
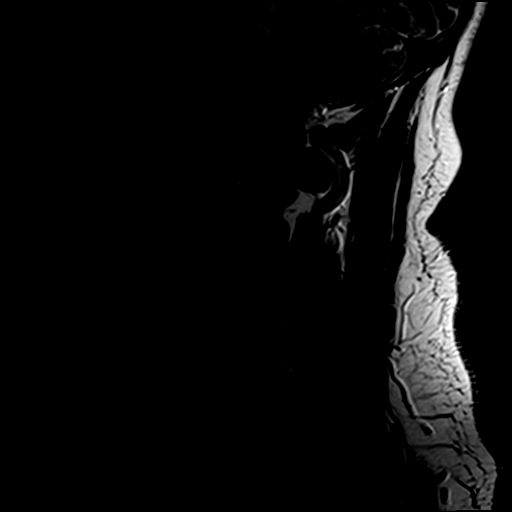
[im 5/13]
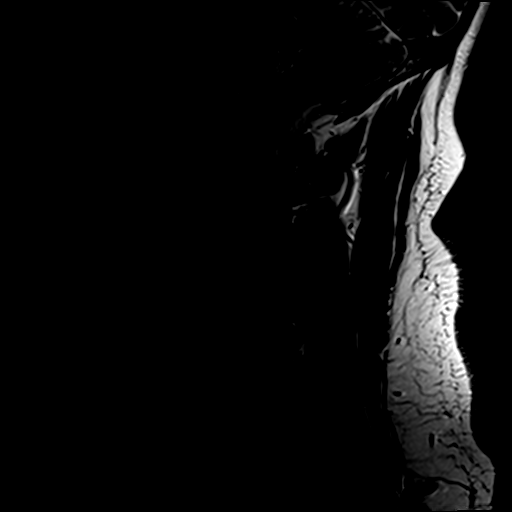
[im 8/13]
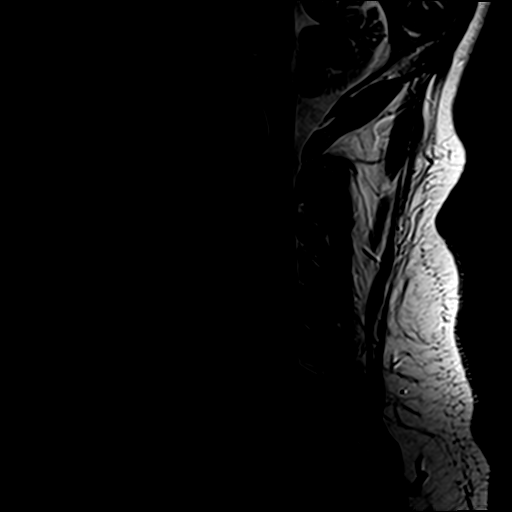
[im 10/13]
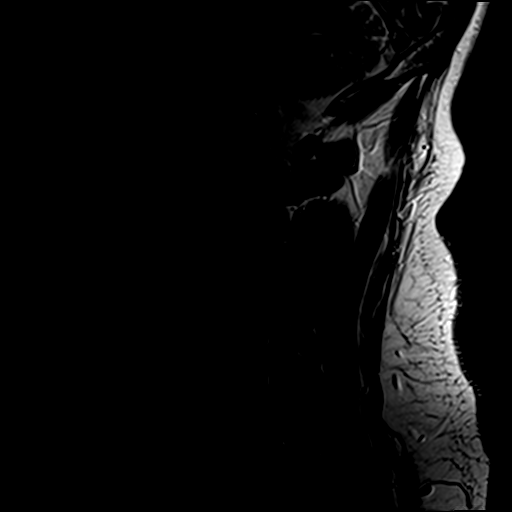
[im 13/13]
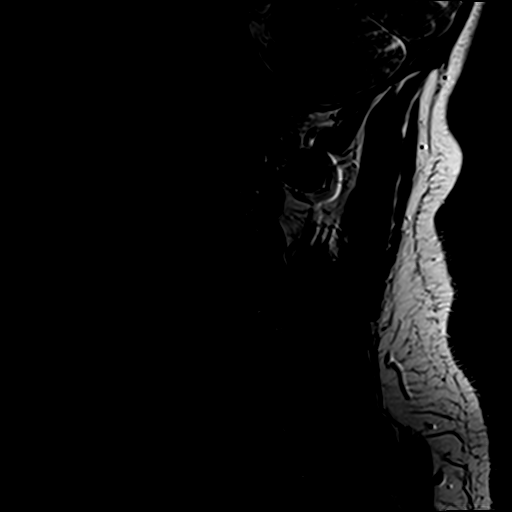

[Series 4: T1 · sagittal · 3.0mm · 0.41mm/px · 3 of 13 slices shown]
[im 3/13]
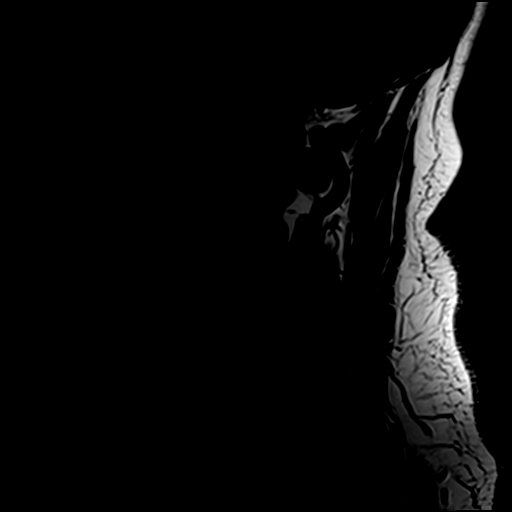
[im 7/13]
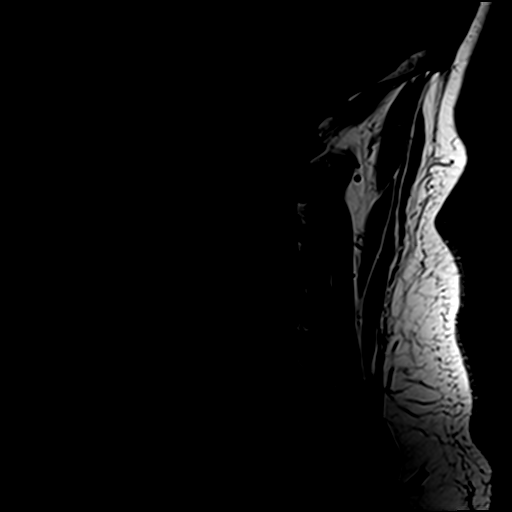
[im 11/13]
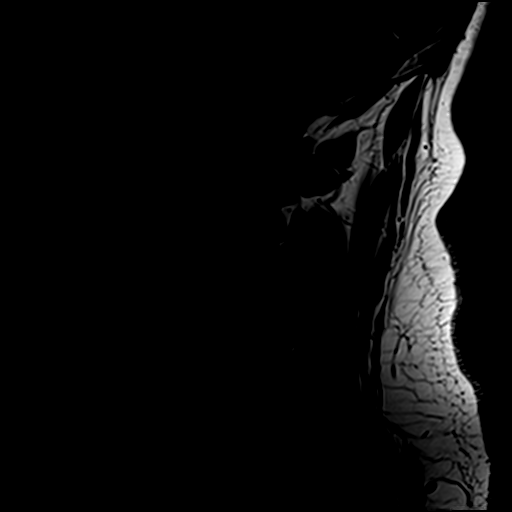

[Series 6: T2 · axial · 3.0mm · 0.39mm/px · z∈[-90,-12]mm · 7 of 26 slices shown (2 of 3)]
[im 1/26]
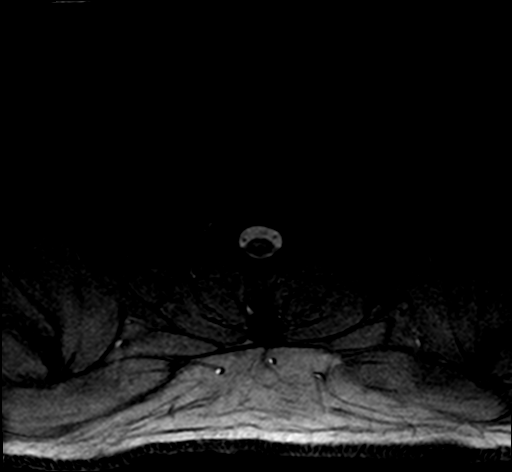
[im 4/26]
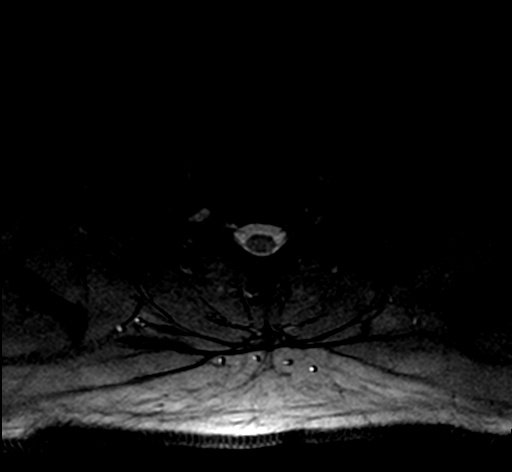
[im 8/26]
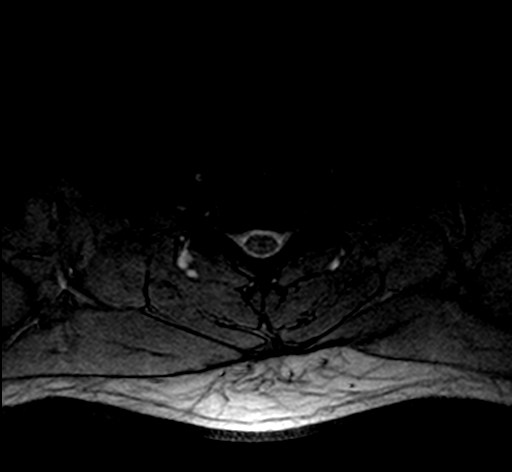
[im 12/26]
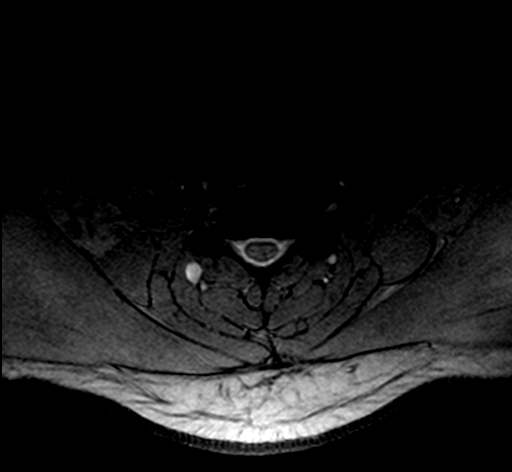
[im 14/26]
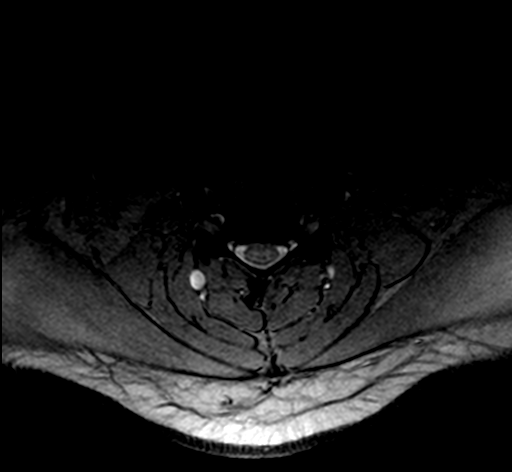
[im 18/26]
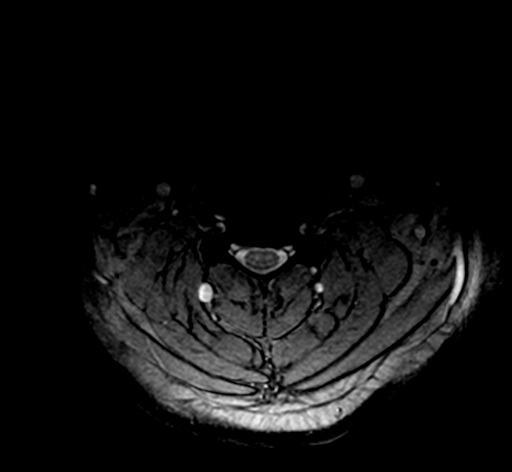
[im 22/26]
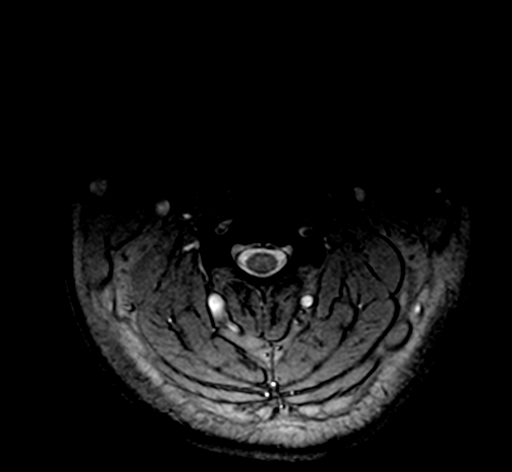

[Series 8: T2 · axial · 3.0mm · 0.39mm/px · z∈[-79,-12]mm · 3 of 26 slices shown (3 of 3)]
[im 4/26]
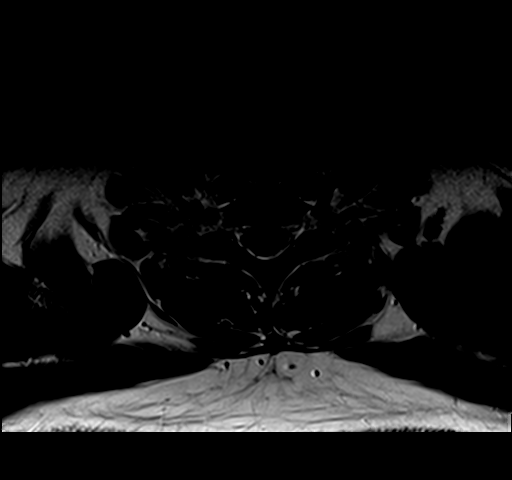
[im 14/26]
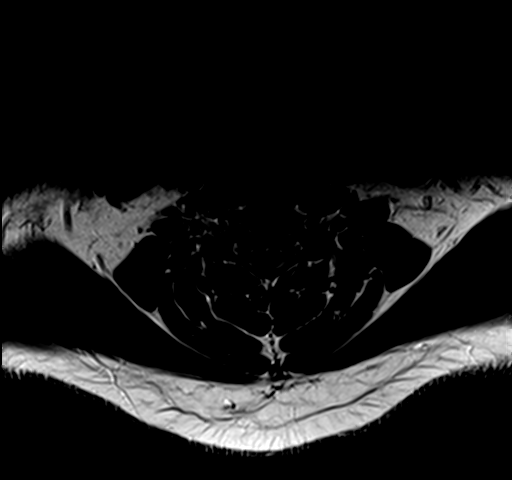
[im 22/26]
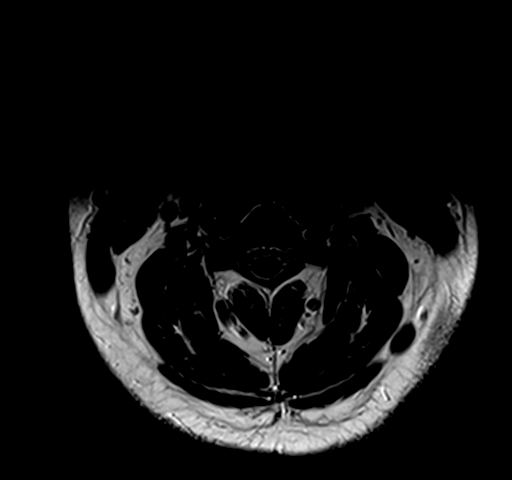

[19 of 48 positions shown; findings below may reference images not displayed]

FINDINGS: Stable chronic straightening and mild reversal of cervical lordosis.
No marrow edema or evidence of acute osseous abnormality. Chronic
endplate degeneration most pronounced at C5-C6.

Cervicomedullary junction is within normal limits. No spinal cord
signal abnormality.

Negative paraspinal soft tissues.

C2-C3:  Mildly increased uncovertebral hypertrophy.  No stenosis.

C3-C4: Chronic uncovertebral hypertrophy. Chronic central disc
bulge. Borderline to mild spinal stenosis appears increased. No
spinal cord mass effect. Moderate to severe bilateral C4 foraminal
stenosis appears increased.

C4-C5: Chronic circumferential disc osteophyte complex. Stable mild
spinal stenosis with no cord mass effect. Mild to moderate left and
moderate to severe right C5 foraminal stenosis are increased.

C5-C6: Interval disc space loss. Chronic circumferential disc
osteophyte complex. Increased mild spinal stenosis with no cord mass
effect. Severe bilateral C6 foraminal stenosis has progressed.

C6-C7: Progressed disc degeneration now with broad-based central
disc protrusion, previously left paracentral. Increased narrowing of
the ventral CSF space without significant spinal stenosis. However,
right greater than left foraminal disc and uncovertebral hypertrophy
results in severe right and moderate left C7 foraminal stenosis
which is new.

C7-T1:  Mild facet hypertrophy, no stenosis.

No upper thoracic spinal stenosis.
IMPRESSION: 1. Progressed cervical spine disc and endplate degeneration since
1888. Subsequently increased neural foraminal stenosis which is now
moderate or severe at the bilateral C4, right C5, bilateral C6, and
bilateral C7 nerve levels.
2. Mild spinal stenosis without spinal cord mass effect from C3-C4
to C5-C6 is stable to mildly increased. No cord signal abnormality.

## 2016-10-15 ENCOUNTER — Ambulatory Visit: Payer: 59 | Admitting: Cardiovascular Disease

## 2016-12-05 ENCOUNTER — Ambulatory Visit (INDEPENDENT_AMBULATORY_CARE_PROVIDER_SITE_OTHER): Payer: 59 | Admitting: Internal Medicine

## 2016-12-05 ENCOUNTER — Encounter: Payer: Self-pay | Admitting: Internal Medicine

## 2016-12-05 VITALS — BP 118/86 | HR 72 | Ht 69.0 in | Wt 250.0 lb

## 2016-12-05 DIAGNOSIS — Z0000 Encounter for general adult medical examination without abnormal findings: Secondary | ICD-10-CM | POA: Diagnosis not present

## 2016-12-05 NOTE — Progress Notes (Signed)
Subjective:    Patient ID: Jimmy Baldwin, male    DOB: 11/11/1970, 46 y.o.   MRN: [id number]  HPI  Here for wellness and f/u;  Overall doing ok;  Pt denies Chest pain, worsening SOB, DOE, wheezing, orthopnea, PND, worsening LE edema, palpitations, dizziness or syncope.  Pt denies neurological change such as new headache, facial or extremity weakness.  Pt denies polydipsia, polyuria, or low sugar symptoms. Pt states overall good compliance with treatment and medications, good tolerability, and has been trying to follow appropriate diet.  Pt denies worsening depressive symptoms, suicidal ideation or panic. No fever, night sweats, wt loss, loss of appetite, or other constitutional symptoms.  Pt states good ability with ADL's, has low fall risk, home safety reviewed and adequate, no other significant changes in hearing or vision, and only occasionally active with exercise. 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   Past Medical History:  Diagnosis Date  . Cervical disc disease    per MRI july 2011  . DJD (degenerative joint disease)   . GERD (gastroesophageal reflux disease)    occ tums  . History of stress test 03-07-2011   EF 56% Normal pattern of perfusion in all regions, low risk scan compared to previous study  . Hyperlipidemia 06/20/2011  . Insomnia 01/11/2016  . PAF (paroxysmal atrial fibrillation) (HCC) 2014  . PONV (postoperative nausea and vomiting)    Past Surgical History:  Procedure Laterality Date  . CARDIAC CATHETERIZATION  06-01-2004   normal cath; chest pain considered to be noncardiac  . CARPAL TUNNEL RELEASE  01/17/2012   Procedure: CARPAL TUNNEL RELEASE;  Surgeon: Nicki ReaperGary R Kuzma, MD;  Location: [facility];  Service: Orthopedics;  Laterality: Left;  left carpal tunnel release  . CARPAL TUNNEL RELEASE  02/21/2012   Procedure: CARPAL TUNNEL RELEASE;  Surgeon: Nicki ReaperGary R Kuzma, MD;   Location: [facility];  Service: Orthopedics;  Laterality: Right;  . KNEE ARTHROSCOPY     lt   . LEFT HEART CATHETERIZATION WITH CORONARY ANGIOGRAM N/A 03/19/2013   Procedure: LEFT HEART CATHETERIZATION WITH CORONARY ANGIOGRAM;  Surgeon: Marykay Lexavid W Harding, MD;  [facility];  Service: Cardiovascular;  Laterality: N/A;  . left shoulder surgury     x 3  . right knee surgury      x 4  - twice with ACL tear  . right shoudler surgury     x 1    reports that he quit smoking about 13 years ago. He has never used smokeless tobacco. He reports that he does not drink alcohol or use drugs. family history includes Diabetes in his other; Healthy in his sister and sister; Heart attack (age of onset: 1055) in his father; Heart disease in his mother and other; Hypertension in his other. Allergies  Allergen Reactions  . Crestor [Rosuvastatin Calcium]     Joint/muscle aching  . Lipitor [Atorvastatin]     Joint/muscle aching   Current Outpatient Prescriptions on File Prior to Visit  Medication Sig Dispense Refill  . acetaminophen (TYLENOL) 325 MG tablet Take 2 tablets (650 mg total) by mouth every 4 (four) hours as needed.    . diclofenac sodium (VOLTAREN) 1 % GEL Apply 4 g topically 4 (four) times daily as needed. 400 g 11  . fenofibrate 54 MG tablet Take 1 tablet (54 mg total) by mouth daily. 30 tablet 11  . flecainide (  TAMBOCOR) 50 MG tablet TAKE 1 TABLET (50 MG TOTAL) BY MOUTH EVERY 12 (TWELVE) HOURS. 60 tablet 10  . ibuprofen (ADVIL,MOTRIN) 200 MG tablet Take 600 mg by mouth daily as needed for pain.    Marland Kitchen ketoconazole (NIZORAL) 2 % cream Apply 1 application topically daily as needed.  11  . metoprolol tartrate (LOPRESSOR) 25 MG tablet Take 1 tablet (25 mg total) by mouth 2 (two) times daily. 180 tablet 1  . metroNIDAZOLE (METROGEL) 0.75 % gel Apply 1 application topically daily as needed.  11  . rosuvastatin (CRESTOR) 5 MG tablet Take 1 tablet every week for 3 weeks, then increase  to 1 tablet every Monday and Friday 10 tablet 1  . zolpidem (AMBIEN) 10 MG tablet Take 1 tablet (10 mg total) by mouth at bedtime as needed for sleep. 90 tablet 1  . furosemide (LASIX) 20 MG tablet Take 0.5 tablets (10 mg total) by mouth daily. 90 tablet 3  . potassium chloride (K-DUR) 10 MEQ tablet Take 1 tablet (10 mEq total) by mouth daily. 90 tablet 3   No current facility-administered medications on file prior to visit.    Review of Systems Constitutional: Negative for other unusual diaphoresis, sweats, appetite or weight changes HENT: Negative for other worsening hearing loss, ear pain, facial swelling, mouth sores or neck stiffness.   Eyes: Negative for other worsening pain, redness or other visual disturbance.  Respiratory: Negative for other stridor or swelling Cardiovascular: Negative for other palpitations or other chest pain  Gastrointestinal: Negative for worsening diarrhea or loose stools, blood in stool, distention or other pain Genitourinary: Negative for hematuria, flank pain or other change in urine volume.  Musculoskeletal: Negative for myalgias or other joint swelling.  Skin: Negative for other color change, or other wound or worsening drainage.  Neurological: Negative for other syncope or numbness. Hematological: Negative for other adenopathy or swelling Psychiatric/Behavioral: Negative for hallucinations, other worsening agitation, SI, self-injury, or new decreased concentration All other system neg per pt    Objective:   Physical Exam BP 118/86   Pulse 72   Ht 5\' 9"  (1.753 m)   Wt 250 lb (113.4 kg)   SpO2 99%   BMI 36.92 kg/m  VS noted,  Constitutional: Pt is oriented to person, place, and time. Appears well-developed and well-nourished, in no significant distress and comfortable Head: Normocephalic and atraumatic  Eyes: Conjunctivae and EOM are normal. Pupils are equal, round, and reactive to light Right Ear: External ear normal without discharge Left Ear:  External ear normal without discharge Nose: Nose without discharge or deformity Mouth/Throat: Oropharynx is without other ulcerations and moist  Neck: Normal range of motion. Neck supple. No JVD present. No tracheal deviation present or significant neck LA or mass Cardiovascular: Normal rate, regular rhythm, normal heart sounds and intact distal pulses.   Pulmonary/Chest: WOB normal and breath sounds without rales or wheezing  Abdominal: Soft. Bowel sounds are normal. NT. No HSM  Musculoskeletal: Normal range of motion. Exhibits no edema Lymphadenopathy: Has no other cervical adenopathy.  Neurological: Pt is alert and oriented to person, place, and time. Pt has normal reflexes. No cranial nerve deficit. Motor grossly intact, Gait intact Skin: Skin is warm and dry. No rash noted or new ulcerations Psychiatric:  Has normal mood and affect. Behavior is normal without agitation No other exam findings Lab Results  Component Value Date   WBC 8.0 01/11/2016   HGB 13.9 01/11/2016   HCT 40.3 01/11/2016   PLT 218.0 01/11/2016  GLUCOSE 112 (H) 10/02/2016   CHOL 241 (H) 10/02/2016   TRIG 215 (H) 10/02/2016   HDL 34 (L) 10/02/2016   LDLDIRECT 148 (H) 10/02/2016   LDLCALC 164 (H) 10/02/2016   ALT 48 (H) 10/02/2016   AST 29 10/02/2016   NA 138 10/02/2016   K 4.3 10/02/2016   CL 100 10/02/2016   CREATININE 1.14 10/02/2016   BUN 13 10/02/2016   CO2 26 10/02/2016   TSH 4.53 (H) 01/11/2016   PSA 0.23 01/11/2016   INR 1.03 03/19/2013   HGBA1C 5.4 03/19/2013       Assessment & Plan:

## 2016-12-05 NOTE — Patient Instructions (Signed)
Your form was filled out today  Please continue all other medications as before, and refills have been done if requested.  Please have the pharmacy call with any other refills you may need.  Please continue your efforts at being more active, low cholesterol diet, and weight control.  You are otherwise up to date with prevention measures today.  Please keep your appointments with your specialists as you may have planned  Please return in 1 year for your yearly visit, or sooner if needed, with Lab testing done 3-5 days before

## 2016-12-08 NOTE — Assessment & Plan Note (Signed)

## 2016-12-27 ENCOUNTER — Telehealth: Payer: Self-pay | Admitting: Internal Medicine

## 2016-12-27 MED ORDER — ZOLPIDEM TARTRATE 10 MG PO TABS: 10.0000 mg | ORAL_TABLET | Freq: Every evening | ORAL | 0 refills | Status: DC | PRN

## 2016-12-27 NOTE — Telephone Encounter (Signed)
Pt left for vacation today and is going to the beach, he states he left his zolpidem (AMBIEN) 10 MG tablet  At his house and would like 7 days worth [facility]  [facility] [address] [place] Last seen 12/05/2016 Please advise

## 2016-12-27 NOTE — Telephone Encounter (Signed)
Done hardcopy to Shirron  

## 2017-01-09 ENCOUNTER — Other Ambulatory Visit: Payer: Self-pay | Admitting: Cardiovascular Disease

## 2017-03-10 ENCOUNTER — Other Ambulatory Visit: Payer: Self-pay | Admitting: Cardiovascular Disease

## 2017-04-10 ENCOUNTER — Other Ambulatory Visit: Payer: Self-pay | Admitting: Cardiovascular Disease

## 2017-04-14 ENCOUNTER — Other Ambulatory Visit: Payer: Self-pay | Admitting: Internal Medicine

## 2017-04-14 NOTE — Telephone Encounter (Signed)
Done erx 

## 2017-08-07 ENCOUNTER — Other Ambulatory Visit: Payer: Self-pay | Admitting: Cardiovascular Disease

## 2017-08-08 NOTE — Telephone Encounter (Signed)
Rx has been sent to the pharmacy electronically. ° °

## 2017-09-04 ENCOUNTER — Other Ambulatory Visit: Payer: Self-pay | Admitting: Cardiovascular Disease

## 2017-09-04 NOTE — Telephone Encounter (Signed)
Per pharmacist visit pt was placed back on fenofibrate 54.

## 2017-09-05 ENCOUNTER — Telehealth: Payer: Self-pay | Admitting: Pharmacist

## 2017-09-05 DIAGNOSIS — E782 Mixed hyperlipidemia: Secondary | ICD-10-CM

## 2017-09-05 MED ORDER — ROSUVASTATIN CALCIUM 5 MG PO TABS: 5.0000 mg | ORAL_TABLET | ORAL | 1 refills | Status: DC

## 2017-09-05 NOTE — Telephone Encounter (Signed)
Patient still taking rosuvastatin weekly and fenofibrate.  He is willing to increase dose to 2x/week.  Will repeat fasting lipid panle in next 1-2 weeks (orders in epic)

## 2017-10-03 ENCOUNTER — Other Ambulatory Visit: Payer: Self-pay | Admitting: Cardiovascular Disease

## 2017-10-03 NOTE — Telephone Encounter (Signed)
Rx request sent to pharmacy.  

## 2017-11-05 ENCOUNTER — Other Ambulatory Visit: Payer: Self-pay | Admitting: Cardiovascular Disease

## 2017-11-07 ENCOUNTER — Other Ambulatory Visit: Payer: Self-pay | Admitting: Internal Medicine

## 2017-11-07 NOTE — Telephone Encounter (Signed)
Done erx 

## 2017-11-26 ENCOUNTER — Other Ambulatory Visit: Payer: Self-pay | Admitting: Cardiovascular Disease

## 2017-11-27 ENCOUNTER — Other Ambulatory Visit: Payer: Self-pay | Admitting: Cardiovascular Disease

## 2017-12-07 ENCOUNTER — Other Ambulatory Visit: Payer: Self-pay | Admitting: Cardiovascular Disease

## 2017-12-16 ENCOUNTER — Other Ambulatory Visit: Payer: Self-pay | Admitting: Cardiovascular Disease

## 2017-12-16 NOTE — Telephone Encounter (Signed)
Rx request sent to pharmacy.  

## 2018-01-02 ENCOUNTER — Other Ambulatory Visit: Payer: Self-pay | Admitting: Cardiovascular Disease

## 2018-01-05 ENCOUNTER — Other Ambulatory Visit: Payer: Self-pay | Admitting: Internal Medicine

## 2018-01-05 NOTE — Telephone Encounter (Signed)
Done erx  Please let pt know, due for ROV

## 2018-01-05 NOTE — Telephone Encounter (Signed)
   LOV: 12/05/16 NextOV: not scheduled Last Filled:11/07/17

## 2018-01-16 ENCOUNTER — Other Ambulatory Visit: Payer: Self-pay | Admitting: Cardiovascular Disease

## 2018-01-20 NOTE — Telephone Encounter (Signed)
Rx sent to pharmacy   

## 2018-02-21 ENCOUNTER — Other Ambulatory Visit: Payer: Self-pay | Admitting: Cardiovascular Disease

## 2018-02-23 NOTE — Telephone Encounter (Signed)
Rx request sent to pharmacy.  

## 2018-02-25 ENCOUNTER — Other Ambulatory Visit: Payer: Self-pay | Admitting: Internal Medicine

## 2018-02-25 NOTE — Telephone Encounter (Signed)
Copied from CRM [id number]. Topic: Quick Communication - Rx Refill/Question >> Feb 25, 2018  4:16 PM Maia Petties wrote: Medication: zolpidem (AMBIEN) 10 MG tablet  - pt is out - last 2 times pt got at the pharmacy they only gave him 15 doses so he asked why - per last 2 RXs in chart should have been 30 doses - pt is scheduled for first available OV with Dr. Jonny Ruiz 03/25/18 - he is requesting CPE at that time if possible - pt requesting call to notify when RX sent in and if CPE can be done 03/25/18  Has the patient contacted their pharmacy? Yes - told to call MD Preferred Pharmacy (with phone number or street name): [phone] [place], [place] - [address] [phone] (Phone) [phone] (Fax)

## 2018-02-26 ENCOUNTER — Other Ambulatory Visit: Payer: Self-pay | Admitting: Cardiovascular Disease

## 2018-02-26 MED ORDER — ZOLPIDEM TARTRATE 10 MG PO TABS: 10.0000 mg | ORAL_TABLET | Freq: Every evening | ORAL | 0 refills | Status: DC | PRN

## 2018-02-26 NOTE — Addendum Note (Signed)
Addended by: Corwin Levins on: 02/26/2018 08:14 AM   Modules accepted: Orders

## 2018-02-26 NOTE — Telephone Encounter (Signed)
Done erx - #15 quantity due to office policy of no further refills after 1 yr and 3 months grace period

## 2018-02-26 NOTE — Telephone Encounter (Signed)
Rx request sent to pharmacy.  

## 2018-03-11 ENCOUNTER — Other Ambulatory Visit: Payer: Self-pay | Admitting: Cardiovascular Disease

## 2018-03-25 ENCOUNTER — Ambulatory Visit: Payer: 59 | Admitting: Internal Medicine

## 2018-03-25 ENCOUNTER — Other Ambulatory Visit (INDEPENDENT_AMBULATORY_CARE_PROVIDER_SITE_OTHER): Payer: 59

## 2018-03-25 ENCOUNTER — Encounter: Payer: Self-pay | Admitting: Internal Medicine

## 2018-03-25 VITALS — BP 124/86 | HR 70 | Temp 98.4°F | Ht 69.0 in | Wt 248.0 lb

## 2018-03-25 DIAGNOSIS — Z114 Encounter for screening for human immunodeficiency virus [HIV]: Secondary | ICD-10-CM

## 2018-03-25 DIAGNOSIS — R739 Hyperglycemia, unspecified: Secondary | ICD-10-CM | POA: Insufficient documentation

## 2018-03-25 DIAGNOSIS — Z0000 Encounter for general adult medical examination without abnormal findings: Secondary | ICD-10-CM

## 2018-03-25 LAB — CBC WITH DIFFERENTIAL/PLATELET
BASOS ABS: 0.1 10*3/uL (ref 0.0–0.1)
Basophils Relative: 0.9 % (ref 0.0–3.0)
EOS ABS: 0.3 10*3/uL (ref 0.0–0.7)
Eosinophils Relative: 3.3 % (ref 0.0–5.0)
HEMATOCRIT: 41.2 % (ref 39.0–52.0)
Hemoglobin: 14.2 g/dL (ref 13.0–17.0)
LYMPHS PCT: 27.3 % (ref 12.0–46.0)
Lymphs Abs: 2.1 10*3/uL (ref 0.7–4.0)
MCHC: 34.4 g/dL (ref 30.0–36.0)
MCV: 94.3 fl (ref 78.0–100.0)
MONOS PCT: 8.7 % (ref 3.0–12.0)
Monocytes Absolute: 0.7 10*3/uL (ref 0.1–1.0)
NEUTROS PCT: 59.8 % (ref 43.0–77.0)
Neutro Abs: 4.5 10*3/uL (ref 1.4–7.7)
Platelets: 211 10*3/uL (ref 150.0–400.0)
RBC: 4.37 Mil/uL (ref 4.22–5.81)
RDW: 12 % (ref 11.5–15.5)
WBC: 7.5 10*3/uL (ref 4.0–10.5)

## 2018-03-25 LAB — LIPID PANEL
CHOL/HDL RATIO: 5
CHOLESTEROL: 206 mg/dL — AB (ref 0–200)
HDL: 40.2 mg/dL (ref >39.00)
LDL CALC: 138 mg/dL — AB (ref 0–99)
NONHDL: 165.49
Triglycerides: 136 mg/dL (ref 0.0–149.0)
VLDL: 27.2 mg/dL (ref 0.0–40.0)

## 2018-03-25 LAB — BASIC METABOLIC PANEL
BUN: 15 mg/dL (ref 6–23)
CALCIUM: 9.4 mg/dL (ref 8.4–10.5)
CO2: 27 meq/L (ref 19–32)
Chloride: 103 mEq/L (ref 96–112)
Creatinine, Ser: 1 mg/dL (ref 0.40–1.50)
GFR: 84.5 mL/min (ref >60.00)
Glucose, Bld: 86 mg/dL (ref 70–99)
Potassium: 4.2 mEq/L (ref 3.5–5.1)
Sodium: 138 mEq/L (ref 135–145)

## 2018-03-25 LAB — HEPATIC FUNCTION PANEL
ALBUMIN: 4.8 g/dL (ref 3.5–5.2)
ALT: 31 U/L (ref 0–53)
AST: 24 U/L (ref 0–37)
Alkaline Phosphatase: 68 U/L (ref 39–117)
BILIRUBIN DIRECT: 0.1 mg/dL (ref 0.0–0.3)
TOTAL PROTEIN: 7.5 g/dL (ref 6.0–8.3)
Total Bilirubin: 0.6 mg/dL (ref 0.2–1.2)

## 2018-03-25 LAB — URINALYSIS, ROUTINE W REFLEX MICROSCOPIC
BILIRUBIN URINE: NEGATIVE
HGB URINE DIPSTICK: NEGATIVE
Ketones, ur: NEGATIVE
Leukocytes, UA: NEGATIVE
Nitrite: NEGATIVE
PH: 5.5 (ref 5.0–8.0)
SPECIFIC GRAVITY, URINE: 1.025 (ref 1.000–1.030)
Total Protein, Urine: NEGATIVE
UROBILINOGEN UA: 0.2 (ref 0.0–1.0)
Urine Glucose: NEGATIVE

## 2018-03-25 LAB — PSA: PSA: 0.22 ng/mL (ref 0.10–4.00)

## 2018-03-25 LAB — TSH: TSH: 3.81 u[IU]/mL (ref 0.35–4.50)

## 2018-03-25 LAB — HEMOGLOBIN A1C: Hgb A1c MFr Bld: 5.9 % (ref 4.6–6.5)

## 2018-03-25 MED ORDER — FUROSEMIDE 20 MG PO TABS: 10.0000 mg | ORAL_TABLET | Freq: Every day | ORAL | 3 refills | Status: DC

## 2018-03-25 MED ORDER — ZOLPIDEM TARTRATE 10 MG PO TABS: 10.0000 mg | ORAL_TABLET | Freq: Every evening | ORAL | 1 refills | Status: DC | PRN

## 2018-03-25 MED ORDER — DICLOFENAC SODIUM 1 % TD GEL: 4.0000 g | Freq: Four times a day (QID) | TRANSDERMAL | 11 refills | Status: DC | PRN

## 2018-03-25 MED ORDER — ROSUVASTATIN CALCIUM 5 MG PO TABS: 5.0000 mg | ORAL_TABLET | ORAL | 3 refills | Status: DC

## 2018-03-25 NOTE — Progress Notes (Signed)
Subjective:    Patient ID: Jimmy Baldwin, male    DOB: 08-16-1970, 47 y.o.   MRN: [id number]  HPI   /Here for wellness and f/u;  Overall doing ok;  Pt denies Chest pain, worsening SOB, DOE, wheezing, orthopnea, PND, worsening LE edema, palpitations, dizziness or syncope.  Pt denies neurological change such as new headache, facial or extremity weakness.  Pt denies polydipsia, polyuria, or low sugar symptoms. Pt states overall good compliance with treatment and medications, good tolerability, and has been trying to follow appropriate diet.  Pt denies worsening depressive symptoms, suicidal ideation or panic. No fever, night sweats, wt loss, loss of appetite, or other constitutional symptoms.  Pt states good ability with ADL's, has low fall risk, home safety reviewed and adequate, no other significant changes in hearing or vision, and only occasionally active with exercise.  Just lost job as his was cut due to budgets, works as Education officer, environmental.  States he takes the crestor once weekly only, bc has been o/w intolerant with joint pain.  Takes fenofibrate daily Wt Readings from Last 3 Encounters:  03/25/18 248 lb (112.5 kg)  12/05/16 250 lb (113.4 kg)  09/02/16 252 lb 6.4 oz (114.5 kg)   Past Medical History:  Diagnosis Date  . Cervical disc disease    per MRI july 2011  . DJD (degenerative joint disease)   . GERD (gastroesophageal reflux disease)    occ tums  . History of stress test 03-07-2011   EF 56% Normal pattern of perfusion in all regions, low risk scan compared to previous study  . Hyperlipidemia 06/20/2011  . Insomnia 01/11/2016  . PAF (paroxysmal atrial fibrillation) (HCC) 2014  . PONV (postoperative nausea and vomiting)    Past Surgical History:  Procedure Laterality Date  . CARDIAC CATHETERIZATION  06-01-2004   normal cath; chest pain considered to be noncardiac  . CARPAL TUNNEL RELEASE  01/17/2012   Procedure: CARPAL TUNNEL RELEASE;  Surgeon: Nicki Reaper, MD;  Location: [facility];  Service: Orthopedics;  Laterality: Left;  left carpal tunnel release  . CARPAL TUNNEL RELEASE  02/21/2012   Procedure: CARPAL TUNNEL RELEASE;  Surgeon: Nicki Reaper, MD;  Location: [facility];  Service: Orthopedics;  Laterality: Right;  . KNEE ARTHROSCOPY     lt   . LEFT HEART CATHETERIZATION WITH CORONARY ANGIOGRAM N/A 03/19/2013   Procedure: LEFT HEART CATHETERIZATION WITH CORONARY ANGIOGRAM;  Surgeon: Marykay Lex, MD;  Location: [facility];  Service: Cardiovascular;  Laterality: N/A;  . left shoulder surgury     x 3  . right knee surgury      x 4  - twice with ACL tear  . right shoudler surgury     x 1    reports that he quit smoking about 15 years ago. He has never used smokeless tobacco. He reports that he does not drink alcohol or use drugs. family history includes Diabetes in his other; Healthy in his sister and sister; Heart attack (age of onset: 59) in his father; Heart disease in his mother and other; Hypertension in his other. Allergies  Allergen Reactions  . Crestor [Rosuvastatin Calcium]     Joint/muscle aching  . Lipitor [Atorvastatin]     Joint/muscle aching   Current Outpatient Medications on File Prior to Visit  Medication Sig Dispense Refill  . acetaminophen (TYLENOL) 325 MG tablet Take 2 tablets (650 mg total) by mouth every 4 (four) hours as needed.    Marland Kitchen  fenofibrate 54 MG tablet TAKE 1 TABLET (54 MG TOTAL) BY MOUTH DAILY. PLEASE CALL AND SCHEDULE AN OFFICE VISIT FOR REFILLS 30 tablet 0  . flecainide (TAMBOCOR) 50 MG tablet Take 1 tablet (50 mg total) by mouth 2 (two) times daily. Please schedule appointment for refills. 180 tablet 0  . ibuprofen (ADVIL,MOTRIN) 200 MG tablet Take 600 mg by mouth daily as needed for pain.    Marland Kitchen ketoconazole (NIZORAL) 2 % cream Apply 1 application topically daily as needed.  11  . metoprolol tartrate (LOPRESSOR) 25 MG tablet TAKE 1 TABLET (25 MG TOTAL) BY MOUTH 2 (TWO) TIMES DAILY. NEED OV. 180 tablet  0  . metroNIDAZOLE (METROGEL) 0.75 % gel Apply 1 application topically daily as needed.  11  . potassium chloride (K-DUR) 10 MEQ tablet Take 1 tablet (10 mEq total) by mouth daily. 90 tablet 3   No current facility-administered medications on file prior to visit.    Review of Systems  Constitutional: Negative for other unusual diaphoresis or sweats HENT: Negative for ear discharge or swelling Eyes: Negative for other worsening visual disturbances Respiratory: Negative for stridor or other swelling  Gastrointestinal: Negative for worsening distension or other blood Genitourinary: Negative for retention or other urinary change Musculoskeletal: Negative for other MSK pain or swelling Skin: Negative for color change or other new lesions Neurological: Negative for worsening tremors and other numbness  Psychiatric/Behavioral: Negative for worsening agitation or other fatigue All other system neg per pt    Objective:   Physical Exam BP 124/86   Pulse 70   Temp 98.4 F (36.9 C) (Oral)   Ht 5\' 9"  (1.753 m)   Wt 248 lb (112.5 kg)   SpO2 96%   BMI 36.62 kg/m  VS noted,  Constitutional: Pt is oriented to person, place, and time. Appears well-developed and well-nourished, in no significant distress and comfortable Head: Normocephalic and atraumatic  Eyes: Conjunctivae and EOM are normal. Pupils are equal, round, and reactive to light Right Ear: External ear normal without discharge Left Ear: External ear normal without discharge Nose: Nose without discharge or deformity Mouth/Throat: Oropharynx is without other ulcerations and moist  Neck: Normal range of motion. Neck supple. No JVD present. No tracheal deviation present or significant neck LA or mass Cardiovascular: Normal rate, regular rhythm, normal heart sounds and intact distal pulses.   Pulmonary/Chest: WOB normal and breath sounds without rales or wheezing  Abdominal: Soft. Bowel sounds are normal. NT. No HSM  Musculoskeletal:  Normal range of motion. Exhibits no edema Lymphadenopathy: Has no other cervical adenopathy.  Neurological: Pt is alert and oriented to person, place, and time. Pt has normal reflexes. No cranial nerve deficit. Motor grossly intact, Gait intact Skin: Skin is warm and dry. No rash noted or new ulcerations Psychiatric:  Has normal mood and affect. Behavior is normal without agitation No other exam findings Lab Results  Component Value Date   WBC 7.5 03/25/2018   HGB 14.2 03/25/2018   HCT 41.2 03/25/2018   PLT 211.0 03/25/2018   GLUCOSE 86 03/25/2018   CHOL 206 (H) 03/25/2018   TRIG 136.0 03/25/2018   HDL 40.20 03/25/2018   LDLDIRECT 148 (H) 10/02/2016   LDLCALC 138 (H) 03/25/2018   ALT 31 03/25/2018   AST 24 03/25/2018   NA 138 03/25/2018   K 4.2 03/25/2018   CL 103 03/25/2018   CREATININE 1.00 03/25/2018   BUN 15 03/25/2018   CO2 27 03/25/2018   TSH 3.81 03/25/2018   PSA 0.22  03/25/2018   INR 1.03 03/19/2013   HGBA1C 5.9 03/25/2018      Assessment & Plan:

## 2018-03-25 NOTE — Assessment & Plan Note (Signed)

## 2018-03-25 NOTE — Assessment & Plan Note (Signed)
Also for a1c 

## 2018-03-25 NOTE — Patient Instructions (Signed)

## 2018-03-26 LAB — HIV ANTIBODY (ROUTINE TESTING W REFLEX): HIV 1&2 Ab, 4th Generation: NONREACTIVE

## 2018-03-27 ENCOUNTER — Other Ambulatory Visit: Payer: Self-pay | Admitting: Cardiovascular Disease

## 2018-05-14 ENCOUNTER — Ambulatory Visit: Payer: 59 | Admitting: Cardiovascular Disease

## 2018-05-15 ENCOUNTER — Encounter (INDEPENDENT_AMBULATORY_CARE_PROVIDER_SITE_OTHER): Payer: Self-pay

## 2018-05-15 ENCOUNTER — Ambulatory Visit: Payer: 59 | Admitting: Cardiovascular Disease

## 2018-05-15 ENCOUNTER — Encounter: Payer: Self-pay | Admitting: Cardiovascular Disease

## 2018-05-15 DIAGNOSIS — E7801 Familial hypercholesterolemia: Secondary | ICD-10-CM | POA: Diagnosis not present

## 2018-05-15 DIAGNOSIS — R079 Chest pain, unspecified: Secondary | ICD-10-CM

## 2018-05-15 MED ORDER — ROSUVASTATIN CALCIUM 5 MG PO TABS: 5.0000 mg | ORAL_TABLET | ORAL | 3 refills | Status: DC

## 2018-05-15 MED ORDER — FENOFIBRATE 54 MG PO TABS: 54.0000 mg | ORAL_TABLET | Freq: Every day | ORAL | 3 refills | Status: DC

## 2018-05-15 MED ORDER — FLECAINIDE ACETATE 50 MG PO TABS: 50.0000 mg | ORAL_TABLET | Freq: Two times a day (BID) | ORAL | 3 refills | Status: DC

## 2018-05-15 MED ORDER — METOPROLOL TARTRATE 25 MG PO TABS: 25.0000 mg | ORAL_TABLET | Freq: Two times a day (BID) | ORAL | 3 refills | Status: DC

## 2018-05-15 MED ORDER — POTASSIUM CHLORIDE ER 10 MEQ PO TBCR: 10.0000 meq | EXTENDED_RELEASE_TABLET | Freq: Every day | ORAL | 3 refills | Status: DC

## 2018-05-15 MED ORDER — FUROSEMIDE 20 MG PO TABS: 10.0000 mg | ORAL_TABLET | Freq: Every day | ORAL | 3 refills | Status: DC

## 2018-05-15 NOTE — Assessment & Plan Note (Signed)
History of PAF on flecainide without recurrence in several years.

## 2018-05-15 NOTE — Progress Notes (Signed)
05/15/2018 Jimmy Baldwin   01/18/1971  [id number]  Primary Physician Corwin LevinsJohn, James W, MD Primary Cardiologist: Runell GessJonathan J Berry MD FACP, [place]ACC, [place]HA, [place]AI  HPI:  Jimmy Baldwin is a 47 y.o.  is a Optician, dispensingminister, with hyperlipidemia and strong family hx of premature CAD  Who I Saw last 06/28/2014.  His first  episode was at his desk developed a fullness in throat, then burning and pressure in his chest and rapid heart rate. He had a Latte that am and thought that was cause, but he also thought he was having a heart attack. Slowly his heart rate slowed And he felt better.Initial EKG SR without changes, ( previous cath in 2006 with normal coronary arteries). He was seen in the office on 10/31 /14 with chest pain and tachycardia palpitations. He had A. Fib with RVR. He was admitted to the hospital and underwent cardiac catheterization by Dr. Susette RacerHardy revealing normal noted and normal LV function. He converted to sinus rhythm he was placed on flecainide. He [facility]/14 with some palpitations but his father had recently passed away. Since that time he's been thin most part asymptomatic.  Since I saw him in the office 4 years ago he is remained stable.  He has had no recurrence of his A. fib.  Denies chest pain or shortness of breath. No outpatient medications have been marked as taking for the 05/15/18 encounter (Office Visit) with Runell GessBerry, Jonathan J, MD.     Allergies  Allergen Reactions  . Crestor [Rosuvastatin Calcium]     Joint/muscle aching  . Lipitor [Atorvastatin]     Joint/muscle aching    Social History   Socioeconomic History  . Marital status: Married    Spouse name: Not on file  . Number of children: Not on file  . Years of education: 4616  . Highest education level: Not on file  Occupational History  . Occupation: Education officer, environmentalastor  Social Needs  . Financial resource strain: Not on file  . Food insecurity:    Worry: Not on file    Inability: Not on file    . Transportation needs:    Medical: Not on file    Non-medical: Not on file  Tobacco Use  . Smoking status: Former Smoker    Last attempt to quit: 01/16/2003    Years since quitting: 15.3  . Smokeless tobacco: Never Used  Substance and Sexual Activity  . Alcohol use: No  . Drug use: No  . Sexual activity: Not on file  Lifestyle  . Physical activity:    Days per week: Not on file    Minutes per session: Not on file  . Stress: Not on file  Relationships  . Social connections:    Talks on phone: Not on file    Gets together: Not on file    Attends religious service: Not on file    Active member of club or organization: Not on file    Attends meetings of clubs or organizations: Not on file    Relationship status: Not on file  . Intimate partner violence:    Fear of current or ex partner: Not on file    Emotionally abused: Not on file    Physically abused: Not on file    Forced sexual activity: Not on file  Other Topics Concern  . Not on file  Social History Narrative  . Not on file     Review of Systems: General:  negative for chills, fever, night sweats or weight changes.  Cardiovascular: negative for chest pain, dyspnea on exertion, edema, orthopnea, palpitations, paroxysmal nocturnal dyspnea or shortness of breath Dermatological: negative for rash Respiratory: negative for cough or wheezing Urologic: negative for hematuria Abdominal: negative for nausea, vomiting, diarrhea, bright red blood per rectum, melena, or hematemesis Neurologic: negative for visual changes, syncope, or dizziness All other systems reviewed and are otherwise negative except as noted above.    Blood pressure 102/64, pulse 71, height 5\' 9"  (1.753 m), weight 250 lb (113.4 kg).  General appearance: alert and no distress Neck: no adenopathy, no carotid bruit, no JVD, supple, symmetrical, trachea midline and thyroid not enlarged, symmetric, no tenderness/mass/nodules Lungs: clear to auscultation  bilaterally Heart: regular rate and rhythm, S1, S2 normal, no murmur, click, rub or gallop Extremities: extremities normal, atraumatic, no cyanosis or edema Pulses: 2+ and symmetric Skin: Skin color, texture, turgor normal. No rashes or lesions Neurologic: Alert and oriented X 3, normal strength and tone. Normal symmetric reflexes. Normal coordination and gait  EKG sinus rhythm at 71 without ST or T wave changes.  I personally reviewed this EKG.  ASSESSMENT AND PLAN:   Hyperlipidemia History of hyperlipidemia/hypertriglyceridemia intolerant to statin therapy on fenofibrate 54 mg recent profile performed 03/25/2018 revealing total cholesterol 206, LDL 138 and HDL 40.  Chest pain with normal coronary angiography 2006 and 03/19/13 History of normal coronary arteries by cath in 2006 and again 03/19/2013 by Dr. Herbie BaltimoreHarding.  PAF, symptomatic- Flecainide initiated 03/19/13 History of PAF on flecainide without recurrence in several years.      Runell GessJonathan J. Berry MD FACP,FACC,FAHA, [facility] Rehabilitation HospitalFSCAI 05/15/2018 3:12 PM

## 2018-05-15 NOTE — Assessment & Plan Note (Signed)
History of normal coronary arteries by cath in 2006 and again 03/19/2013 by Dr. Herbie BaltimoreHarding.

## 2018-05-15 NOTE — Patient Instructions (Signed)
Medication Instructions:  Your physician recommends that you continue on your current medications as directed. Please refer to the Current Medication list given to you today.  If you need a refill on your cardiac medications before your next appointment, please call your pharmacy.   Lab work: NONE If you have labs (blood work) drawn today and your tests are completely normal, you will receive your results only by: . MyChart Message (if you have MyChart) OR . A paper copy in the mail If you have any lab test that is abnormal or we need to change your treatment, we will call you to review the results.  Testing/Procedures: NONE  Follow-Up: At [facility], you and your health needs are our priority.  As part of our continuing mission to provide you with exceptional heart care, we have created designated Provider Care Teams.  These Care Teams include your primary Cardiologist (physician) and Advanced Practice Providers (APPs -  Physician Assistants and Nurse Practitioners) who all work together to provide you with the care you need, when you need it. You will need a follow up appointment in 12 months.  Please call our office 2 months in advance to schedule this appointment.  You may see DR. BERRY or one of the following Advanced Practice Providers on your designated Care Team:   Luke Kilroy, PA-C Krista Kroeger, PA-C . Callie Goodrich, PA-C    

## 2018-05-15 NOTE — Assessment & Plan Note (Signed)
History of hyperlipidemia/hypertriglyceridemia intolerant to statin therapy on fenofibrate 54 mg recent profile performed 03/25/2018 revealing total cholesterol 206, LDL 138 and HDL 40.

## 2018-11-30 ENCOUNTER — Other Ambulatory Visit: Payer: Self-pay | Admitting: Cardiovascular Disease

## 2018-12-01 ENCOUNTER — Other Ambulatory Visit: Payer: Self-pay | Admitting: Physician Assistant

## 2018-12-01 MED ORDER — FLECAINIDE ACETATE 50 MG PO TABS: 50.0000 mg | ORAL_TABLET | Freq: Two times a day (BID) | ORAL | 1 refills | Status: DC

## 2018-12-01 MED ORDER — METOPROLOL TARTRATE 25 MG PO TABS: 25.0000 mg | ORAL_TABLET | Freq: Two times a day (BID) | ORAL | 1 refills | Status: DC

## 2018-12-16 ENCOUNTER — Telehealth: Payer: Self-pay | Admitting: Internal Medicine

## 2018-12-16 MED ORDER — ZOLPIDEM TARTRATE 10 MG PO TABS: 10.0000 mg | ORAL_TABLET | Freq: Every evening | ORAL | 0 refills | Status: DC | PRN

## 2018-12-16 NOTE — Telephone Encounter (Signed)
Done erx 

## 2018-12-16 NOTE — Telephone Encounter (Signed)
Medication Refill - Medication:  zolpidem (AMBIEN) 10 MG tablet  Has the patient contacted their pharmacy? Yes advised to call  Preferred Pharmacy (with phone number or street name):  [id number] [address], [place] [phone] (Phone) [phone] (Fax)   Agent: Please be advised that RX refills may take up to 3 business days. We ask that you follow-up with your pharmacy.

## 2019-01-26 DIAGNOSIS — Z9889 Other specified postprocedural states: Secondary | ICD-10-CM | POA: Insufficient documentation

## 2019-03-31 ENCOUNTER — Telehealth: Payer: Self-pay | Admitting: Internal Medicine

## 2019-03-31 NOTE — Telephone Encounter (Signed)
Medication Refill - Medication: zolpidem (AMBIEN) 10 MG tablet  Has the patient contacted their pharmacy? no (Agent: If no, request that the patient contact the pharmacy for the refill.) (Agent: If yes, when and what did the pharmacy advise?)  Preferred Pharmacy (with phone number or street name):  [id number] [address], [place] [phone] (Phone) [phone] (Fax)   Agent: Please be advised that RX refills may take up to 3 business days. We ask that you follow-up with your pharmacy.

## 2019-04-01 MED ORDER — ZOLPIDEM TARTRATE 10 MG PO TABS: 10.0000 mg | ORAL_TABLET | Freq: Every evening | ORAL | 0 refills | Status: DC | PRN

## 2019-04-01 NOTE — Telephone Encounter (Signed)
ambien 1 mo [facility]  Pt due for ROV please

## 2019-05-20 ENCOUNTER — Telehealth: Payer: Self-pay | Admitting: Internal Medicine

## 2019-05-20 NOTE — Telephone Encounter (Signed)
Medication Refill - Medication: zolpidem (AMBIEN) 10 MG tablet [[id number]]    Has the patient contacted their pharmacy? No. (Agent: If no, request that the patient contact the pharmacy for the refill.) (Agent: If yes, when and what did the pharmacy advise?)  Preferred Pharmacy (with phone number or street name):  [id number] [address], [address] Phone:  [phone]       Agent: Please be advised that RX refills may take up to 3 business days. We ask that you follow-up with your pharmacy.

## 2019-05-25 MED ORDER — ZOLPIDEM TARTRATE 10 MG PO TABS: 10.0000 mg | ORAL_TABLET | Freq: Every evening | ORAL | 0 refills | Status: DC | PRN

## 2019-05-25 NOTE — Telephone Encounter (Signed)
Done erx  Pleases to call pt - for ROV for further refills

## 2019-05-25 NOTE — Telephone Encounter (Signed)
Check [place] registry last filled 04/01/2019../lmb  

## 2019-05-27 ENCOUNTER — Other Ambulatory Visit: Payer: Self-pay | Admitting: Physician Assistant

## 2019-05-27 ENCOUNTER — Other Ambulatory Visit: Payer: Self-pay | Admitting: Cardiovascular Disease

## 2019-06-13 ENCOUNTER — Other Ambulatory Visit: Payer: Self-pay | Admitting: Physician Assistant

## 2019-07-09 ENCOUNTER — Other Ambulatory Visit: Payer: Self-pay | Admitting: Cardiovascular Disease

## 2019-07-09 ENCOUNTER — Other Ambulatory Visit: Payer: Self-pay | Admitting: Physician Assistant

## 2019-07-09 MED ORDER — METOPROLOL TARTRATE 25 MG PO TABS: 25.0000 mg | ORAL_TABLET | Freq: Two times a day (BID) | ORAL | 0 refills | Status: DC

## 2019-07-09 NOTE — Telephone Encounter (Signed)
New Message    *STAT* If patient is at the pharmacy, call can be transferred to refill team.   1. Which medications need to be refilled? (please list name of each medication and dose if known) metoprolol tartrate (LOPRESSOR) 25 MG tablet  2. Which pharmacy/location (including street and city if local pharmacy) is medication to be sent to? [id number] [place], [place] - [address]  3. Do they need a 30 day or 90 day supply? 30 day   Patient is out of medication and needs today. Annual visit has been scheduled.

## 2019-07-23 ENCOUNTER — Ambulatory Visit (INDEPENDENT_AMBULATORY_CARE_PROVIDER_SITE_OTHER): Payer: 59 | Admitting: Cardiovascular Disease

## 2019-07-23 ENCOUNTER — Encounter: Payer: Self-pay | Admitting: Cardiovascular Disease

## 2019-07-23 ENCOUNTER — Other Ambulatory Visit: Payer: Self-pay

## 2019-07-23 VITALS — BP 124/72 | HR 62 | Ht 69.0 in | Wt 252.6 lb

## 2019-07-23 DIAGNOSIS — E782 Mixed hyperlipidemia: Secondary | ICD-10-CM

## 2019-07-23 DIAGNOSIS — I480 Paroxysmal atrial fibrillation: Secondary | ICD-10-CM

## 2019-07-23 DIAGNOSIS — R079 Chest pain, unspecified: Secondary | ICD-10-CM

## 2019-07-23 LAB — HEPATIC FUNCTION PANEL
ALT: 33 IU/L (ref 0–44)
AST: 24 IU/L (ref 0–40)
Albumin: 4.6 g/dL (ref 4.0–5.0)
Alkaline Phosphatase: 80 IU/L (ref 39–117)
Bilirubin Total: 0.4 mg/dL (ref 0.0–1.2)
Bilirubin, Direct: 0.12 mg/dL (ref 0.00–0.40)
Total Protein: 7.1 g/dL (ref 6.0–8.5)

## 2019-07-23 LAB — LIPID PANEL
Chol/HDL Ratio: 4.4 ratio (ref 0.0–5.0)
Cholesterol, Total: 168 mg/dL (ref 100–199)
HDL: 38 mg/dL — ABNORMAL LOW (ref >39)
LDL Chol Calc (NIH): 102 mg/dL — ABNORMAL HIGH (ref 0–99)
Triglycerides: 157 mg/dL — ABNORMAL HIGH (ref 0–149)
VLDL Cholesterol Cal: 28 mg/dL (ref 5–40)

## 2019-07-23 MED ORDER — PANTOPRAZOLE SODIUM 40 MG PO TBEC: 40.0000 mg | DELAYED_RELEASE_TABLET | Freq: Every day | ORAL | 11 refills | Status: DC

## 2019-07-23 NOTE — Patient Instructions (Signed)
Medication Instructions:  Start taking 40mg  Protonix Daily   If you need a refill on your cardiac medications before your next appointment, please call your pharmacy.   Lab work: Lipid and Hepatic Function If you have labs (blood work) drawn today and your tests are completely normal, you will receive your results only by: MyChart Message (if you have MyChart) OR A paper copy in the mail If you have any lab test that is abnormal or we need to change your treatment, we will call you to review the results.  Testing/Procedures: NONE  Follow-Up: At [facility], you and your health needs are our priority.  As part of our continuing mission to provide you with exceptional heart care, we have created designated Provider Care Teams.  These Care Teams include your primary Cardiologist (physician) and Advanced Practice Providers (APPs -  Physician Assistants and Nurse Practitioners) who all work together to provide you with the care you need, when you need it. You may see Dr. [facility] or one of the following Advanced Practice Providers on your designated Care Team:    Allyson Sabal, PA-C  [place], [place]  [place], Edd Fabian  Your physician wants you to follow-up in: 1 year with Dr. [place]. You will receive a reminder letter in the mail two months in advance. If you don't receive a letter, please call our office to schedule the follow-up appointment.

## 2019-07-23 NOTE — Assessment & Plan Note (Signed)
History of atypical chest pain in the past with normal cardiac catheterization performed by myself at the [facility] heart center 06/01/2004 and by Dr. Herbie Baltimore 03/19/2013.  He has had some atypical positional chest pain several months ago which has not recurred.  I do not think this is cardiac.

## 2019-07-23 NOTE — Assessment & Plan Note (Signed)
History of paroxysmal atrial fibrillation in the past spontaneously converting to sinus rhythm.  He has been on flecainide without recurrence.

## 2019-07-23 NOTE — Progress Notes (Signed)
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   is a Optician, dispensing, with hyperlipidemia and strong family hx of premature CAD who I last saw in the office 05/15/2018.Marland Kitchen His first episode was at his desk developed a fullness in throat, then burning and pressure in his chest and rapid heart rate. He had a Latte that am and thought that was cause, but he also thought he was having a heart attack. Slowly his heart rate slowed And he felt better.Initial EKG SR without changes, ( previous cath in 2006 with normal coronary arteries). He was seen in the office on 10/31 /14 with chest pain and tachycardia palpitations. He had A. Fib with RVR. He was admitted to the hospital and underwent cardiac catheterization by Dr. Susette Baldwin revealing normal noted and normal LV function. He converted to sinus rhythm he was placed on flecainide. He saw Jimmy [facility] in the office 01/19/14 with some palpitations but his father had recently passed away. Since that time he's been thin most part asymptomatic.  Since I saw him in the office a year ago he is remained stable.  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 He has had no recurrence of his A. fib.  Denies chest pain or shortness of breath. Active Medications      Current Meds  Medication Sig  . acetaminophen (TYLENOL) 325 MG tablet Take 2 tablets (650 mg total) by mouth every 4 (four) hours as needed.  . diclofenac sodium (VOLTAREN) 1 % GEL Apply 4 g topically 4 (four) times daily as needed.  . fenofibrate 54 MG tablet Take 1 tablet (54 mg total) by mouth daily. NEED OV FOR FUTURE REFILL.  . flecainide (TAMBOCOR) 50 MG tablet Take 1 tablet (50 mg total) by mouth 2 (two) times daily. NEED OV FOR FUTURE REFILL.  Marland Kitchen ibuprofen (ADVIL,MOTRIN) 200 MG tablet  Take 600 mg by mouth daily as needed for pain.  Marland Kitchen ketoconazole (NIZORAL) 2 % cream Apply 1 application topically daily as needed.  . metoprolol tartrate (LOPRESSOR) 25 MG tablet Take 1 tablet (25 mg total) by mouth 2 (two) times daily. Please make annual appt for refills. [phone]. 1st attempt.  . metroNIDAZOLE (METROGEL) 0.75 % gel Apply 1 application topically daily as needed.  . rosuvastatin (CRESTOR) 5 MG tablet Take 1 tablet (5 mg total) by mouth once a week.  . zolpidem (AMBIEN) 10 MG tablet Take 1 tablet (10 mg total) by mouth at bedtime as needed. for sleep     Allergies  Allergen Reactions  . Crestor [Rosuvastatin Calcium]     Joint/muscle aching  . Lipitor [Atorvastatin]     Joint/muscle aching    Social History   Socioeconomic History  . Marital status: Married    Spouse name: Not on file  . Number of children: Not on file  . Years of education: 79  . Highest education level: Not on file  Occupational History  . Occupation: Pastor  Tobacco Use  . Smoking status: Former Smoker    Quit date: 01/16/2003    Years since quitting: 16.5  . Smokeless tobacco: Never Used  Substance and Sexual Activity  . Alcohol use: No  . Drug use: No  . Sexual activity: Not on file  Other Topics Concern  .  Not on file  Social History Narrative  . Not on file   Social Determinants of Health   Financial Resource Strain:   . Difficulty of Paying Living Expenses: Not on file  Food Insecurity:   . Worried About Charity fundraiser in the Last Year: Not on file  . Ran Out of Food in the Last Year: Not on file  Transportation Needs:   . Lack of Transportation (Medical): Not on file  . Lack of Transportation (Non-Medical): Not on file  Physical Activity:   . Days of Exercise per Week: Not on file  . Minutes of Exercise per Session: Not on file  Stress:   . Feeling of Stress : Not on file  Social Connections:   . Frequency of Communication with Friends and Family: Not on file  .  Frequency of Social Gatherings with Friends and Family: Not on file  . Attends Religious Services: Not on file  . Active Member of Clubs or Organizations: Not on file  . Attends Archivist Meetings: Not on file  . Marital Status: Not on file  Intimate Partner Violence:   . Fear of Current or Ex-Partner: Not on file  . Emotionally Abused: Not on file  . Physically Abused: Not on file  . Sexually Abused: Not on file     Review of Systems: General: negative for chills, fever, night sweats or weight changes.  Cardiovascular: negative for chest pain, dyspnea on exertion, edema, orthopnea, palpitations, paroxysmal nocturnal dyspnea or shortness of breath Dermatological: negative for rash Respiratory: negative for cough or wheezing Urologic: negative for hematuria Abdominal: negative for nausea, vomiting, diarrhea, bright red blood per rectum, melena, or hematemesis Neurologic: negative for visual changes, syncope, or dizziness All other systems reviewed and are otherwise negative except as noted above.    Blood pressure 124/72, pulse 62, height 5\' 9"  (1.753 m), weight 252 lb 9.6 oz (114.6 kg), SpO2 98 %.  General appearance: alert and no distress Neck: no adenopathy, no carotid bruit, no JVD, supple, symmetrical, trachea midline and thyroid not enlarged, symmetric, no tenderness/mass/nodules Lungs: clear to auscultation bilaterally Heart: regular rate and rhythm, S1, S2 normal, no murmur, click, rub or gallop Extremities: extremities normal, atraumatic, no cyanosis or edema Pulses: 2+ and symmetric Skin: Skin color, texture, turgor normal. No rashes or lesions Neurologic: Alert and oriented X 3, normal strength and tone. Normal symmetric reflexes. Normal coordination and gait  EKG sinus rhythm at 62 without ST or T wave changes.  I personally reviewed this EKG.  ASSESSMENT AND PLAN:   Hyperlipidemia History of hyperlipidemia on Crestor with lipid profile performed 03/25/2018  revealing a total cholesterol of 206, LDL of 138 and HDL 40.  We will recheck a lipid liver profile.  Chest pain with normal coronary angiography 2006 and 03/19/13 History of atypical chest pain in the past with normal cardiac catheterization performed by myself at the [facility] 06/01/2004 and by Dr. Ellyn Hack 03/19/2013.  He has had some atypical positional chest pain several months ago which has not recurred.  I do not think this is cardiac.  PAF, symptomatic- Flecainide initiated 03/19/13 History of paroxysmal atrial fibrillation in the past spontaneously converting to sinus rhythm.  He has been on flecainide without recurrence.      Lorretta Harp MD FACP,FACC,FAHA, [facility] 07/23/2019 8:23 AM

## 2019-07-23 NOTE — Assessment & Plan Note (Signed)
History of hyperlipidemia on Crestor with lipid profile performed 03/25/2018 revealing a total cholesterol of 206, LDL of 138 and HDL 40.  We will recheck a lipid liver profile.

## 2019-07-30 ENCOUNTER — Telehealth: Payer: Self-pay | Admitting: Internal Medicine

## 2019-07-30 NOTE — Telephone Encounter (Signed)
    Patient states he always gets his medications refilled without having an appointment. Last seen 2019. Patient states he wants med filled today, only one pill remaining.  1.Medication Requested: zolpidem (AMBIEN) 10 MG tablet  2. Pharmacy (Name, Street, City):[phone] - [place], [place] - [address]  3. On Med List: yes  4. Last Visit with PCP: 03/25/2018  5. Next visit date with PCP: 08/02/19   Agent: Please be advised that RX refills may take up to 3 business days. We ask that you follow-up with your pharmacy.

## 2019-07-31 NOTE — Telephone Encounter (Signed)
Very sorry, we are unable to refill due to office refill policy; please consider ROV

## 2019-08-02 ENCOUNTER — Encounter: Payer: Self-pay | Admitting: Internal Medicine

## 2019-08-02 ENCOUNTER — Ambulatory Visit (INDEPENDENT_AMBULATORY_CARE_PROVIDER_SITE_OTHER): Payer: 59 | Admitting: Internal Medicine

## 2019-08-02 ENCOUNTER — Other Ambulatory Visit: Payer: Self-pay

## 2019-08-02 VITALS — BP 124/82 | HR 78 | Temp 97.9°F | Ht 69.0 in | Wt 250.0 lb

## 2019-08-02 DIAGNOSIS — Z0000 Encounter for general adult medical examination without abnormal findings: Secondary | ICD-10-CM

## 2019-08-02 DIAGNOSIS — R739 Hyperglycemia, unspecified: Secondary | ICD-10-CM

## 2019-08-02 MED ORDER — ZOLPIDEM TARTRATE 10 MG PO TABS: 10.0000 mg | ORAL_TABLET | Freq: Every evening | ORAL | 1 refills | Status: DC | PRN

## 2019-08-02 NOTE — Assessment & Plan Note (Signed)

## 2019-08-02 NOTE — Assessment & Plan Note (Signed)
stable overall by history and exam, recent data reviewed with pt, and pt to continue medical treatment as before,  to f/u any worsening symptoms or concerns  

## 2019-08-02 NOTE — Progress Notes (Signed)
Subjective:    Patient ID: Jimmy Baldwin, male    DOB: 05-28-1970, 49 y.o.   MRN: [id number]  HPI  Here for wellness and f/u;  Overall doing ok;  Pt denies Chest pain, worsening SOB, DOE, wheezing, orthopnea, PND, worsening LE edema, palpitations, dizziness or syncope.  Pt denies neurological change such as new headache, facial or extremity weakness.  Pt denies polydipsia, polyuria, or low sugar symptoms. Pt states overall good compliance with treatment and medications, good tolerability, and has been trying to follow appropriate diet.  Pt denies worsening depressive symptoms, suicidal ideation or panic. No fever, night sweats, wt loss, loss of appetite, or other constitutional symptoms.  Pt states good ability with ADL's, has low fall risk, home safety reviewed and adequate, no other significant changes in hearing or vision, and only occasionally active with exercise. No new complaints Past Medical History:  Diagnosis Date  . Cervical disc disease    per MRI july 2011  . DJD (degenerative joint disease)   . GERD (gastroesophageal reflux disease)    occ tums  . History of stress test 03-07-2011   EF 56% Normal pattern of perfusion in all regions, low risk scan compared to previous study  . Hyperlipidemia 06/20/2011  . Insomnia 01/11/2016  . PAF (paroxysmal atrial fibrillation) ([place]) 2014  . PONV (postoperative nausea and vomiting)    Past Surgical History:  Procedure Laterality Date  . CARDIAC CATHETERIZATION  06-01-2004   normal cath; chest pain considered to be noncardiac  . CARPAL TUNNEL RELEASE  01/17/2012   Procedure: CARPAL TUNNEL RELEASE;  Surgeon: Wynonia Sours, MD;  Location: [place];  Service: Orthopedics;  Laterality: Left;  left carpal tunnel release  . CARPAL TUNNEL RELEASE  02/21/2012   Procedure: CARPAL TUNNEL RELEASE;  Surgeon: Wynonia Sours, MD;  Location: [place];  Service: Orthopedics;  Laterality: Right;  . KNEE ARTHROSCOPY     lt     . LEFT HEART CATHETERIZATION WITH CORONARY ANGIOGRAM N/A 03/19/2013   Procedure: LEFT HEART CATHETERIZATION WITH CORONARY ANGIOGRAM;  Surgeon: Leonie Man, MD;  Location: [facility] C[facility] LAB;  Service: Cardiovascular;  Laterality: N/A;  . left shoulder surgury     x 3  . right knee surgury      x 4  - twice with ACL tear  . right shoudler surgury     x 1    reports that he quit smoking about 16 years ago. He has never used smokeless tobacco. He reports that he does not drink alcohol or use drugs. family history includes Diabetes in an other family member; Healthy in his sister and sister; Heart attack (age of onset: 61) in his father; Heart disease in his mother and another family member; Hypertension in an other family member. Allergies  Allergen Reactions  . Crestor [Rosuvastatin Calcium]     Joint/muscle aching  . Lipitor [Atorvastatin]     Joint/muscle aching   Current Outpatient Medications on File Prior to Visit  Medication Sig Dispense Refill  . acetaminophen (TYLENOL) 325 MG tablet Take 2 tablets (650 mg total) by mouth every 4 (four) hours as needed.    . diclofenac sodium (VOLTAREN) 1 % GEL Apply 4 g topically 4 (four) times daily as needed. 400 g 11  . fenofibrate 54 MG tablet Take 1 tablet (54 mg total) by mouth daily. NEED OV FOR FUTURE REFILL. 90 tablet 3  . flecainide (TAMBOCOR) 50 MG tablet Take 1 tablet (50  mg total) by mouth 2 (two) times daily. NEED OV FOR FUTURE REFILL. 180 tablet 1  . furosemide (LASIX) 20 MG tablet Take 0.5 tablets (10 mg total) by mouth daily. 45 tablet 3  . ibuprofen (ADVIL,MOTRIN) 200 MG tablet Take 600 mg by mouth daily as needed for pain.    Marland Kitchen ketoconazole (NIZORAL) 2 % cream Apply 1 application topically daily as needed.  11  . metoprolol tartrate (LOPRESSOR) 25 MG tablet Take 1 tablet (25 mg total) by mouth 2 (two) times daily. Please make annual appt for refills. [phone]. 1st attempt. 60 tablet 0  . metroNIDAZOLE (METROGEL) 0.75 % gel  Apply 1 application topically daily as needed.  11  . pantoprazole (PROTONIX) 40 MG tablet Take 1 tablet (40 mg total) by mouth daily. 30 tablet 11  . potassium chloride (K-DUR) 10 MEQ tablet Take 1 tablet (10 mEq total) by mouth daily. 90 tablet 3   No current facility-administered medications on file prior to visit.   Review of Systems All otherwise neg per pt     Objective:   Physical Exam BP 124/82   Pulse 78   Temp 97.9 F (36.6 C)   Ht 5\' 9"  (1.753 m)   Wt 250 lb (113.4 kg)   SpO2 100%   BMI 36.92 kg/m  VS noted,  Constitutional: Pt appears in NAD HENT: Head: NCAT.  Right Ear: External ear normal.  Left Ear: External ear normal.  Eyes: . Pupils are equal, round, and reactive to light. Conjunctivae and EOM are normal Nose: without d/c or deformity Neck: Neck supple. Gross normal ROM Cardiovascular: Normal rate and regular rhythm.   Pulmonary/Chest: Effort normal and breath sounds without rales or wheezing.  Abd:  Soft, NT, ND, + BS, no organomegaly Neurological: Pt is alert. At baseline orientation, motor grossly intact Skin: Skin is warm. No rashes, other new lesions, no LE edema Psychiatric: Pt behavior is normal without agitation  Lab Results  Component Value Date   WBC 7.5 03/25/2018   HGB 14.2 03/25/2018   HCT 41.2 03/25/2018   PLT 211.0 03/25/2018   GLUCOSE 86 03/25/2018   CHOL 168 07/23/2019   TRIG 157 (H) 07/23/2019   HDL 38 (L) 07/23/2019   LDLDIRECT 148 (H) 10/02/2016   LDLCALC 102 (H) 07/23/2019   ALT 33 07/23/2019   AST 24 07/23/2019   NA 138 03/25/2018   K 4.2 03/25/2018   CL 103 03/25/2018   CREATININE 1.00 03/25/2018   BUN 15 03/25/2018   CO2 27 03/25/2018   TSH 3.81 03/25/2018   PSA 0.22 03/25/2018   INR 1.03 03/19/2013   HGBA1C 5.9 03/25/2018          Assessment & Plan:

## 2019-08-02 NOTE — Patient Instructions (Addendum)
Please remember to call for your yearly eye exam  Please call if you change your mind about having the colonoscopy  Please continue all other medications as before, and refills have been done if requested.  Please have the pharmacy call with any other refills you may need.  Please continue your efforts at being more active, low cholesterol diet, and weight control.  You are otherwise up to date with prevention measures today.  Please keep your appointments with your specialists as you may have planned  Please go to the LAB at the blood drawing area for the tests to be done at the First Floor Lab tomorrow (or soon)  You will be contacted by phone if any changes need to be made immediately.  Otherwise, you will receive a letter about your results with an explanation, but please check with MyChart first.  Please remember to sign up for MyChart if you have not done so, as this will be important to you in the future with finding out test results, communicating by private email, and scheduling acute appointments online when needed.  Please make an Appointment to return for your 1 year visit, or sooner if needed

## 2019-08-02 NOTE — Telephone Encounter (Signed)
Tried to call pt, unable to leave a vm.

## 2019-08-10 ENCOUNTER — Other Ambulatory Visit: Payer: Self-pay | Admitting: Cardiovascular Disease

## 2019-11-19 ENCOUNTER — Other Ambulatory Visit: Payer: Self-pay | Admitting: Physician Assistant

## 2020-03-14 ENCOUNTER — Other Ambulatory Visit: Payer: Self-pay

## 2020-03-14 ENCOUNTER — Encounter: Payer: Self-pay | Admitting: Internal Medicine

## 2020-03-14 ENCOUNTER — Ambulatory Visit (INDEPENDENT_AMBULATORY_CARE_PROVIDER_SITE_OTHER): Payer: 59 | Admitting: Internal Medicine

## 2020-03-14 VITALS — BP 122/76 | HR 76 | Temp 98.4°F | Ht 69.0 in | Wt 225.0 lb

## 2020-03-14 DIAGNOSIS — R739 Hyperglycemia, unspecified: Secondary | ICD-10-CM | POA: Diagnosis not present

## 2020-03-14 DIAGNOSIS — Z0000 Encounter for general adult medical examination without abnormal findings: Secondary | ICD-10-CM | POA: Diagnosis not present

## 2020-03-14 DIAGNOSIS — Z1159 Encounter for screening for other viral diseases: Secondary | ICD-10-CM

## 2020-03-14 LAB — URINALYSIS, ROUTINE W REFLEX MICROSCOPIC
Bilirubin Urine: NEGATIVE
Hgb urine dipstick: NEGATIVE
Ketones, ur: NEGATIVE
Leukocytes,Ua: NEGATIVE
Nitrite: NEGATIVE
RBC / HPF: NONE SEEN (ref 0–?)
Specific Gravity, Urine: 1.025 (ref 1.000–1.030)
Total Protein, Urine: NEGATIVE
Urine Glucose: NEGATIVE
Urobilinogen, UA: 1 (ref 0.0–1.0)
pH: 6 (ref 5.0–8.0)

## 2020-03-14 LAB — BASIC METABOLIC PANEL
BUN: 17 mg/dL (ref 6–23)
CO2: 28 mEq/L (ref 19–32)
Calcium: 9 mg/dL (ref 8.4–10.5)
Chloride: 104 mEq/L (ref 96–112)
Creatinine, Ser: 0.9 mg/dL (ref 0.40–1.50)
GFR: 99.44 mL/min (ref >60.00)
Glucose, Bld: 121 mg/dL — ABNORMAL HIGH (ref 70–99)
Potassium: 4.2 mEq/L (ref 3.5–5.1)
Sodium: 140 mEq/L (ref 135–145)

## 2020-03-14 LAB — CBC WITH DIFFERENTIAL/PLATELET
Basophils Absolute: 0 10*3/uL (ref 0.0–0.1)
Basophils Relative: 0.5 % (ref 0.0–3.0)
Eosinophils Absolute: 0.2 10*3/uL (ref 0.0–0.7)
Eosinophils Relative: 3.6 % (ref 0.0–5.0)
HCT: 41.3 % (ref 39.0–52.0)
Hemoglobin: 14 g/dL (ref 13.0–17.0)
Lymphocytes Relative: 26 % (ref 12.0–46.0)
Lymphs Abs: 1.1 10*3/uL (ref 0.7–4.0)
MCHC: 33.9 g/dL (ref 30.0–36.0)
MCV: 94 fl (ref 78.0–100.0)
Monocytes Absolute: 0.5 10*3/uL (ref 0.1–1.0)
Monocytes Relative: 10.7 % (ref 3.0–12.0)
Neutro Abs: 2.6 10*3/uL (ref 1.4–7.7)
Neutrophils Relative %: 59.2 % (ref 43.0–77.0)
Platelets: 180 10*3/uL (ref 150.0–400.0)
RBC: 4.4 Mil/uL (ref 4.22–5.81)
RDW: 12.4 % (ref 11.5–15.5)
WBC: 4.3 10*3/uL (ref 4.0–10.5)

## 2020-03-14 LAB — HEPATIC FUNCTION PANEL
ALT: 41 U/L (ref 0–53)
AST: 28 U/L (ref 0–37)
Albumin: 4.5 g/dL (ref 3.5–5.2)
Alkaline Phosphatase: 65 U/L (ref 39–117)
Bilirubin, Direct: 0.1 mg/dL (ref 0.0–0.3)
Total Bilirubin: 0.5 mg/dL (ref 0.2–1.2)
Total Protein: 7 g/dL (ref 6.0–8.3)

## 2020-03-14 LAB — TSH: TSH: 2.51 u[IU]/mL (ref 0.35–4.50)

## 2020-03-14 LAB — LIPID PANEL
Cholesterol: 164 mg/dL (ref 0–200)
HDL: 46.2 mg/dL (ref >39.00)
LDL Cholesterol: 93 mg/dL (ref 0–99)
NonHDL: 117.82
Total CHOL/HDL Ratio: 4
Triglycerides: 125 mg/dL (ref 0.0–149.0)
VLDL: 25 mg/dL (ref 0.0–40.0)

## 2020-03-14 LAB — PSA: PSA: 0.19 ng/mL (ref 0.10–4.00)

## 2020-03-14 LAB — HEMOGLOBIN A1C: Hgb A1c MFr Bld: 6.1 % (ref 4.6–6.5)

## 2020-03-14 MED ORDER — ZOLPIDEM TARTRATE 10 MG PO TABS
10.0000 mg | ORAL_TABLET | Freq: Every evening | ORAL | 1 refills | Status: DC | PRN
Start: 2020-03-14 — End: 2020-09-28

## 2020-03-14 NOTE — Patient Instructions (Addendum)

## 2020-03-14 NOTE — Progress Notes (Addendum)
Subjective:    Patient ID: Jimmy Baldwin, male    DOB: 1970-11-04, 49 y.o.   MRN: [id number]  HPI Here for wellness and f/u;  Overall doing ok;  Pt denies Chest pain, worsening SOB, DOE, wheezing, orthopnea, PND, worsening LE edema, palpitations, dizziness or syncope.  Pt denies neurological change such as new headache, facial or extremity weakness.  Pt denies polydipsia, polyuria, or low sugar symptoms. Pt states overall good compliance with treatment and medications, good tolerability, and has been trying to follow appropriate diet.  Pt denies worsening depressive symptoms, suicidal ideation or panic. No fever, night sweats, wt loss, loss of appetite, or other constitutional symptoms.  Pt states good ability with ADL's, has low fall risk, home safety reviewed and adequate, no other significant changes in hearing or vision, and only occasionally active with exercise.  Lost wt intentiionally with better diet Wt Readings from Last 3 Encounters:  03/14/20 225 lb (102.1 kg)  08/02/19 250 lb (113.4 kg)  07/23/19 252 lb 9.6 oz (114.6 kg)   Past Medical History:  Diagnosis Date  . Cervical disc disease    per MRI july 2011  . DJD (degenerative joint disease)   . GERD (gastroesophageal reflux disease)    occ tums  . History of stress test 03-07-2011   EF 56% Normal pattern of perfusion in all regions, low risk scan compared to previous study  . Hyperlipidemia 06/20/2011  . Insomnia 01/11/2016  . PAF (paroxysmal atrial fibrillation) ([facility]) 2014  . PONV (postoperative nausea and vomiting)    Past Surgical History:  Procedure Laterality Date  . CARDIAC CATHETERIZATION  06-01-2004   normal cath; chest pain considered to be noncardiac  . CARPAL TUNNEL RELEASE  01/17/2012   Procedure: CARPAL TUNNEL RELEASE;  Surgeon: Nicki Reaper, MD;  Location: [facility];  Service: Orthopedics;  Laterality: Left;  left carpal tunnel release  . CARPAL TUNNEL RELEASE  02/21/2012   Procedure: CARPAL  TUNNEL RELEASE;  Surgeon: Nicki Reaper, MD;  Location: [facility];  Service: Orthopedics;  Laterality: Right;  . KNEE ARTHROSCOPY     lt   . LEFT HEART CATHETERIZATION WITH CORONARY ANGIOGRAM N/A 03/19/2013   Procedure: LEFT HEART CATHETERIZATION WITH CORONARY ANGIOGRAM;  Surgeon: Marykay Lex, MD;  Location: [facility] CATH LAB;  Service: Cardiovascular;  Laterality: N/A;  . left shoulder surgury     x 3  . right knee surgury      x 4  - twice with ACL tear  . right shoudler surgury     x 1    reports that he quit smoking about 17 years ago. He has never used smokeless tobacco. He reports that he does not drink alcohol and does not use drugs. family history includes Diabetes in an other family member; Healthy in his sister and sister; Heart attack (age of onset: 65) in his father; Heart disease in his mother and another family member; Hypertension in an other family member. Allergies  Allergen Reactions  . Crestor [Rosuvastatin Calcium]     Joint/muscle aching  . Lipitor [Atorvastatin]     Joint/muscle aching   Current Outpatient Medications on File Prior to Visit  Medication Sig Dispense Refill  . acetaminophen (TYLENOL) 325 MG tablet Take 2 tablets (650 mg total) by mouth every 4 (four) hours as needed.    . diclofenac sodium (VOLTAREN) 1 % GEL Apply 4 g topically 4 (four) times daily as needed. 400 g 11  .  fenofibrate 54 MG tablet Take 1 tablet (54 mg total) by mouth daily. NEED OV FOR FUTURE REFILL. 90 tablet 3  . flecainide (TAMBOCOR) 50 MG tablet Take 1 tablet by mouth twice daily 180 tablet 3  . ibuprofen (ADVIL,MOTRIN) 200 MG tablet Take 600 mg by mouth daily as needed for pain.    Marland Kitchen ketoconazole (NIZORAL) 2 % cream Apply 1 application topically daily as needed.  11  . metoprolol tartrate (LOPRESSOR) 25 MG tablet TAKE 1 TABLET 2 (TWO) TIMES DAILY. PLEASE MAKE ANNUAL APPT FOR REFILLS. [phone]. 1ST ATTEMPT. 60 tablet 11  . metroNIDAZOLE (METROGEL) 0.75 % gel Apply  1 application topically daily as needed.  11  . pantoprazole (PROTONIX) 40 MG tablet Take 1 tablet (40 mg total) by mouth daily. 30 tablet 11  . furosemide (LASIX) 20 MG tablet Take 0.5 tablets (10 mg total) by mouth daily. 45 tablet 3  . potassium chloride (K-DUR) 10 MEQ tablet Take 1 tablet (10 mEq total) by mouth daily. 90 tablet 3   No current facility-administered medications on file prior to visit.   Review of Systems All otherwise neg per pt     Objective:   Physical Exam BP 122/76 (BP Location: Left Arm, Patient Position: Sitting, Cuff Size: Large)   Pulse 76   Temp 98.4 F (36.9 C) (Oral)   Ht 5\' 9"  (1.753 m)   Wt 225 lb (102.1 kg)   SpO2 92%   BMI 33.23 kg/m  VS noted,  Constitutional: Pt appears in NAD HENT: Head: NCAT.  Right Ear: External ear normal.  Left Ear: External ear normal.  Eyes: . Pupils are equal, round, and reactive to light. Conjunctivae and EOM are normal Nose: without d/c or deformity Neck: Neck supple. Gross normal ROM Cardiovascular: Normal rate and regular rhythm.   Pulmonary/Chest: Effort normal and breath sounds without rales or wheezing.  Abd:  Soft, NT, ND, + BS, no organomegaly Neurological: Pt is alert. At baseline orientation, motor grossly intact Skin: Skin is warm. No rashes, other new lesions, no LE edema Psychiatric: Pt behavior is normal without agitation  All otherwise neg per pt Lab Results  Component Value Date   WBC 4.3 03/14/2020   HGB 14.0 03/14/2020   HCT 41.3 03/14/2020   PLT 180.0 03/14/2020   GLUCOSE 121 (H) 03/14/2020   CHOL 164 03/14/2020   TRIG 125.0 03/14/2020   HDL 46.20 03/14/2020   LDLDIRECT 148 (H) 10/02/2016   LDLCALC 93 03/14/2020   ALT 41 03/14/2020   AST 28 03/14/2020   NA 140 03/14/2020   K 4.2 03/14/2020   CL 104 03/14/2020   CREATININE 0.90 03/14/2020   BUN 17 03/14/2020   CO2 28 03/14/2020   TSH 2.51 03/14/2020   PSA 0.19 03/14/2020   INR 1.03 03/19/2013   HGBA1C 6.1 03/14/2020        Assessment & Plan:

## 2020-03-14 NOTE — Assessment & Plan Note (Signed)
stable overall by history and exam, recent data reviewed with pt, and pt to continue medical treatment as before,  to f/u any worsening symptoms or concerns  

## 2020-03-15 LAB — HEPATITIS C ANTIBODY
Hepatitis C Ab: NONREACTIVE
SIGNAL TO CUT-OFF: 0.01 (ref <1.00)

## 2020-05-15 ENCOUNTER — Telehealth: Payer: Self-pay

## 2020-05-15 NOTE — Telephone Encounter (Signed)
I think a 72 hour slot would be fine. Thank you! Alver Sorrow, NP

## 2020-05-15 NOTE — Telephone Encounter (Signed)
Called and spoke with patient in regards to the need for an appointment and an EKG prior to cardiac clearance. He was very hesitant and wanted to know why this is necessary and says that he has had multiple surgeries without an appointment with cardiology. I explained that this was due to the length of time that has passed since his last visit. He ultimately agreed to schedule an appointment but would like a call to discuss further.

## 2020-05-15 NOTE — Telephone Encounter (Signed)
Called and spoke with [facility]. He verbalized concerns regarding cost of appointment. We discussed need for EKG as he is on antiarrhythmic therapy and last appointment was 07/2019. We did discuss that this would likely count for his annual visit and he would not require repeat evaluation 07/2020. He asked if this was a new process and I shared with him that our office policy was for an office visit if not seen in last 6 months. He was agreeable to his scheduled appointment date and time. Will route to Joni Reining, NP as he is seeing her for preop clearance 05/27/19.   Alver Sorrow, NP

## 2020-05-15 NOTE — Telephone Encounter (Signed)
Jimmy Baldwin is a 49 yo male with PMH of hyperlipidemia, atrial fibrillation on flecainide. He was last seen 07/2019 by Dr. Allyson Sabal. As it has been >6 months since last appointment, will route to preop callback team to assist with scheduling for office visit for cardiac clearance.  Alver Sorrow, NP

## 2020-05-15 NOTE — Telephone Encounter (Signed)
The only availability for next week are TOC slots and 24 or 72 Hr holds. Do you think it would be okay to use one of those?

## 2020-05-15 NOTE — Telephone Encounter (Signed)
   [facility] Pre-operative Risk Assessment    Request for surgical clearance:  1. What type of surgery is being performed? LEFT UNICOMPARTMENTAL KNEE LEFT MEDIALLY   2. When is this surgery scheduled? 06-01-2020   3. What type of clearance is required (medical clearance vs. Pharmacy clearance to hold med vs. Both)? MEDICAL  4. Are there any medications that need to be held prior to surgery and how long? NONE LISTED   5. Practice name and name of physician performing surgery? [facility]  Paralee Cancel, MD  ATTN:SHERRY  6. What is the office phone number? [phone]   7.   What is the office fax number? [phone]  8.   Anesthesia type (None, local, MAC, general) ? Spinal

## 2020-05-15 NOTE — Telephone Encounter (Signed)
   Primary Cardiologist: Dr. Allyson Sabal  Chart reviewed as part of pre-operative protocol coverage. Because of Bandon Sherwin Betty's past medical history and time since last visit, he will require a follow-up visit in order to better assess preoperative cardiovascular risk. This has been scheduled for 05/27/19 with Joni Reining, NP. Patient made aware of appointment date and time. I will route to requesitng party via Epic fax function so they are aware. Clearance, if appropriate, will be sent after appointment 05/27/19.    Alver Sorrow, NP  05/15/2020, 4:05 PM

## 2020-05-16 NOTE — Progress Notes (Signed)
Cardiology Office Note   Date:  05/26/2020   ID:  Jimmy Baldwin, DOB 05-23-1970, MRN [id number]  PCP:  Corwin Levins, MD  Cardiologist: Dr. Allyson Sabal  CC: Pre-Operative Clearance   History of Present Illness: Jimmy Baldwin is a 49 y.o. male who presents for preoperative evaluation.  He is being followed for paroxysmal atrial fibrillation on flecainide, hyperlipidemia.  He is to be scheduled for left knee unicompartmental surgery by Dr. Durene Romans. Since he has not been seen in our office since March 2021, he is to be evaluated in person prior to releasing for surgery.  He comes today without any complaints of cardiac chest discomfort, dyspnea on exertion, fatigue, or new cardiac symptoms.  He cleaned out his garage last week and was doing some heavy lifting, he has some left pectoral soreness that he states comes in waves and pulses and then goes away on its own.  It has not stopped him from his normal activities.  He has no associated left arm jaw pain shortness of breath or diaphoresis.  Past Medical History:  Diagnosis Date  . Cervical disc disease    per MRI july 2011  . DJD (degenerative joint disease)   . GERD (gastroesophageal reflux disease)    occ tums  . History of stress test 03-07-2011   EF 56% Normal pattern of perfusion in all regions, low risk scan compared to previous study  . Hyperlipidemia 06/20/2011  . Insomnia 01/11/2016  . PAF (paroxysmal atrial fibrillation) (HCC) 2014  . PONV (postoperative nausea and vomiting)     Past Surgical History:  Procedure Laterality Date  . CARDIAC CATHETERIZATION  06-01-2004   normal cath; chest pain considered to be noncardiac  . CARPAL TUNNEL RELEASE  01/17/2012   Procedure: CARPAL TUNNEL RELEASE;  Surgeon: Nicki Reaper, MD;  Location: [facility];  Service: Orthopedics;  Laterality: Left;  left carpal tunnel release  . CARPAL TUNNEL RELEASE  02/21/2012   Procedure: CARPAL TUNNEL RELEASE;  Surgeon: Nicki Reaper, MD;   Location: [facility];  Service: Orthopedics;  Laterality: Right;  . KNEE ARTHROSCOPY     lt   . LEFT HEART CATHETERIZATION WITH CORONARY ANGIOGRAM N/A 03/19/2013   Procedure: LEFT HEART CATHETERIZATION WITH CORONARY ANGIOGRAM;  Surgeon: Marykay Lex, MD;  Location: [facility] CATH LAB;  Service: Cardiovascular;  Laterality: N/A;  . left shoulder surgury     x 3  . right knee surgury      x 4  - twice with ACL tear  . right shoudler surgury     x 1     Current Outpatient Medications  Medication Sig Dispense Refill  . fenofibrate 54 MG tablet TAKE 1 TABLET (54 MG TOTAL) BY MOUTH DAILY. NEED OV FOR FUTURE REFILL. 90 tablet 3  . flecainide (TAMBOCOR) 50 MG tablet Take 1 tablet by mouth twice daily (Patient taking differently: Take 50 mg by mouth 2 (two) times daily.) 180 tablet 3  . ibuprofen (ADVIL,MOTRIN) 200 MG tablet Take 600 mg by mouth daily as needed for pain.    . metoprolol tartrate (LOPRESSOR) 25 MG tablet TAKE 1 TABLET 2 (TWO) TIMES DAILY. PLEASE MAKE ANNUAL APPT FOR REFILLS. [phone]. 1ST ATTEMPT. (Patient taking differently: Take 25 mg by mouth 2 (two) times daily.) 60 tablet 11  . zolpidem (AMBIEN) 10 MG tablet Take 1 tablet (10 mg total) by mouth at bedtime as needed. for sleep 90 tablet 1   No current facility-administered medications  for this visit.    Allergies:   Crestor [rosuvastatin calcium] and Lipitor [atorvastatin]    Social History:  The patient  reports that he quit smoking about 17 years ago. He has never used smokeless tobacco. He reports that he does not drink alcohol and does not use drugs.   Family History:  The patient's family history includes Diabetes in an other family member; Healthy in his sister and sister; Heart attack (age of onset: 35) in his father; Heart disease in his mother and another family member; Hypertension in an other family member.    ROS: All other systems are reviewed and negative. Unless otherwise mentioned in  H&P  PHYSICAL EXAM: VS:  BP 132/76   Pulse 66   Ht 5\' 9"  (1.753 m)   Wt 234 lb (106.1 kg)   BMI 34.56 kg/m  , BMI Body mass index is 34.56 kg/m. GEN: Well nourished, well developed, in no acute distress HEENT: normal Neck: no JVD, carotid bruits, or masses Cardiac: RRR; no murmurs, rubs, or gallops,no edema  Respiratory:  Clear to auscultation bilaterally, normal work of breathing GI: soft, nontender, nondistended, + BS, obese MS: no deformity or atrophy Skin: warm and dry, no rash Neuro:  Strength and sensation are intact Psych: euthymic mood, full affect   EKG: Normal sinus rhythm, heart rate of 66 bpm, with sinus arrhythmia, some artifact is noted.  Recent Labs: 03/14/2020: ALT 41; BUN 17; Creatinine, Ser 0.90; Hemoglobin 14.0; Platelets 180.0; Potassium 4.2; Sodium 140; TSH 2.51    Lipid Panel    Component Value Date/Time   CHOL 164 03/14/2020 1357   CHOL 168 07/23/2019 0843   TRIG 125.0 03/14/2020 1357   HDL 46.20 03/14/2020 1357   HDL 38 (L) 07/23/2019 0843   CHOLHDL 4 03/14/2020 1357   VLDL 25.0 03/14/2020 1357   LDLCALC 93 03/14/2020 1357   LDLCALC 102 (H) 07/23/2019 0843   LDLDIRECT 148 (H) 10/02/2016 1011      Wt Readings from Last 3 Encounters:  05/26/20 234 lb (106.1 kg)  03/14/20 225 lb (102.1 kg)  08/02/19 250 lb (113.4 kg)      Other studies Reviewed: See scanned reports.  Catheterization in 2006 with normal coronary arteries.   ASSESSMENT AND PLAN:  1.  Cardiology preoperative evaluation:   Chart reviewed as part of pre-operative protocol coverage. Given past medical history and time since last visit, based on ACC/AHA guidelines, Jimmy Baldwin would be at acceptable risk for the planned procedure without further cardiovascular testing Cardiology is available if needed postoperatively.   2.  Paroxysmal atrial fibrillation: No longer on anticoagulation therapy.  Remains on flecainide.  No symptoms associated or recurrences of rapid heart  rhythm.  3.  Hyperlipidemia: Most recent lipid profile completed on 03/14/2020 with total cholesterol 164 HDL 46.2 LDL 93 triglycerides 125.  Continues on diltiazem for rate control.  4.  Hypertension: Currently well controlled.  No changes in regimen at this time.   Current medicines are reviewed at length with the patient today.  I have spent 25 dedicated to the care of this patient on the date of this encounter to include pre-visit review of records, assessment, management and diagnostic testing,with shared decision making.  Labs/ tests ordered today include: None  03/16/2020. Bettey Mare, ANP, AACC   05/26/2020 11:53 AM    [facility] [address] Office [phone] Fax [phone]  Notice: This dictation was prepared with Dragon dictation along with smaller  Company secretary. Any transcriptional errors that result from this process are unintentional and may not be corrected upon review.

## 2020-05-19 ENCOUNTER — Other Ambulatory Visit: Payer: Self-pay | Admitting: Cardiovascular Disease

## 2020-05-25 NOTE — Patient Instructions (Addendum)
DUE TO COVID-19 ONLY ONE VISITOR IS ALLOWED TO COME WITH YOU AND STAY IN THE WAITING ROOM ONLY DURING PRE OP AND PROCEDURE DAY OF SURGERY. THE 1 VISITOR  MAY VISIT WITH YOU AFTER SURGERY IN YOUR PRIVATE ROOM DURING VISITING HOURS ONLY!  YOU NEED TO HAVE A COVID 19 TEST ON__1/11____ @___9 :55____, THIS TEST MUST BE DONE BEFORE SURGERY,  COVID TESTING SITE [address] [place] , IT IS ON THE RIGHT GOING OUT [address] APPROXIMATELY  2 MINUTES PAST [employer] ON THE RIGHT. ONCE YOUR COVID TEST IS COMPLETED,  PLEASE BEGIN THE QUARANTINE INSTRUCTIONS AS OUTLINED IN YOUR HANDOUT.                [id number]    Your procedure is scheduled on:06/01/20    Report to [facility] Main  Entrance   Report to admitting at 9:15 AM     Call this number if you have problems the morning of surgery [phone]   . BRUSH YOUR TEETH MORNING OF SURGERY AND RINSE YOUR MOUTH OUT, NO CHEWING GUM CANDY OR MINTS.   No food after midnight.    You may have clear liquid until 8:30 AM.    At 8:00 AM drink pre surgery drink.   Nothing by mouth after 8:30 AM.   Take these medicines the morning of surgery with A SIP OF WATER: Flecainide, Metoprolol                                 You may not have any metal on your body including              piercings  Do not wear jewelry,  lotions, powders or deodorant                         Men may shave face and neck.   Do not bring valuables to the hospital. [facility] IS NOT             RESPONSIBLE   FOR VALUABLES.  Contacts, dentures or bridgework may not be worn into surgery.       Patients discharged the day of surgery will not be allowed to drive home. IF YOU ARE HAVING SURGERY AND GOING HOME THE SAME DAY, YOU MUST HAVE AN ADULT TO DRIVE YOU HOME AND BE WITH YOU FOR 24 HOURS. YOU MAY GO HOME BY TAXI OR UBER OR ORTHERWISE, BUT AN ADULT MUST ACCOMPANY YOU HOME AND STAY WITH YOU FOR 24 HOURS.  Name and phone number of  your driver:  Special Instructions: N/A              Please read over the following fact sheets you were given: _____________________________________________________________________             [facility] - Preparing for Surgery Before surgery, you can play an important role.  Because skin is not sterile, your skin needs to be as free of germs as possible.  You can reduce the number of germs on your skin by washing with CHG (chlorahexidine gluconate) soap before surgery.  CHG is an antiseptic cleaner which kills germs and bonds with the skin to continue killing germs even after washing. Please DO NOT use if you have an allergy to CHG or antibacterial soaps.  If your skin becomes reddened/irritated stop using the CHG and inform your nurse when  you arrive at Short Stay. Do not shave (including legs and underarms) for at least 48 hours prior to the first CHG shower.  You may shave your face/neck. Please follow these instructions carefully:  1.  Shower with CHG Soap the night before surgery and the  morning of Surgery.  2.  If you choose to wash your hair, wash your hair first as usual with your  normal  shampoo.  3.  After you shampoo, rinse your hair and body thoroughly to remove the  shampoo.                           4.  Use CHG as you would any other liquid soap.  You can apply chg directly  to the skin and wash                       Gently with a scrungie or clean washcloth.  5.  Apply the CHG Soap to your body ONLY FROM THE NECK DOWN.   Do not use on face/ open                           Wound or open sores. Avoid contact with eyes, ears mouth and genitals (private parts).                       Wash face,  Genitals (private parts) with your normal soap.             6.  Wash thoroughly, paying special attention to the area where your surgery  will be performed.  7.  Thoroughly rinse your body with warm water from the neck down.  8.  DO NOT shower/wash with your normal soap after using and  rinsing off  the CHG Soap.                9.  Pat yourself dry with a clean towel.            10.  Wear clean pajamas.            11.  Place clean sheets on your bed the night of your first shower and do not  sleep with pets. Day of Surgery : Do not apply any lotions/deodorants the morning of surgery.  Please wear clean clothes to the hospital/surgery center.  FAILURE TO FOLLOW THESE INSTRUCTIONS MAY RESULT IN THE CANCELLATION OF YOUR SURGERY PATIENT SIGNATURE_________________________________  NURSE SIGNATURE__________________________________  ________________________________________________________________________   Rogelia Mire  An incentive spirometer is a tool that can help keep your lungs clear and active. This tool measures how well you are filling your lungs with each breath. Taking long deep breaths may help reverse or decrease the chance of developing breathing (pulmonary) problems (especially infection) following:  A long period of time when you are unable to move or be active. BEFORE THE PROCEDURE   If the spirometer includes an indicator to show your best effort, your nurse or respiratory therapist will set it to a desired goal.  If possible, sit up straight or lean slightly forward. Try not to slouch.  Hold the incentive spirometer in an upright position. INSTRUCTIONS FOR USE  1. Sit on the edge of your bed if possible, or sit up as far as you can in bed or on a chair. 2. Hold the incentive spirometer in an upright position. 3. Breathe out normally.  4. Place the mouthpiece in your mouth and seal your lips tightly around it. 5. Breathe in slowly and as deeply as possible, raising the piston or the ball toward the top of the column. 6. Hold your breath for 3-5 seconds or for as long as possible. Allow the piston or ball to fall to the bottom of the column. 7. Remove the mouthpiece from your mouth and breathe out normally. 8. Rest for a few seconds and repeat  Steps 1 through 7 at least 10 times every 1-2 hours when you are awake. Take your time and take a few normal breaths between deep breaths. 9. The spirometer may include an indicator to show your best effort. Use the indicator as a goal to work toward during each repetition. 10. After each set of 10 deep breaths, practice coughing to be sure your lungs are clear. If you have an incision (the cut made at the time of surgery), support your incision when coughing by placing a pillow or rolled up towels firmly against it. Once you are able to get out of bed, walk around indoors and cough well. You may stop using the incentive spirometer when instructed by your caregiver.  RISKS AND COMPLICATIONS  Take your time so you do not get dizzy or light-headed.  If you are in pain, you may need to take or ask for pain medication before doing incentive spirometry. It is harder to take a deep breath if you are having pain. AFTER USE  Rest and breathe slowly and easily.  It can be helpful to keep track of a log of your progress. Your caregiver can provide you with a simple table to help with this. If you are using the spirometer at home, follow these instructions: [place] IF:   You are having difficultly using the spirometer.  You have trouble using the spirometer as often as instructed.  Your pain medication is not giving enough relief while using the spirometer.  You develop fever of 100.5 F (38.1 C) or higher. SEEK IMMEDIATE MEDICAL CARE IF:   You cough up bloody sputum that had not been present before.  You develop fever of 102 F (38.9 C) or greater.  You develop worsening pain at or near the incision site. MAKE SURE YOU:   Understand these instructions.  Will watch your condition.  Will get help right away if you are not doing well or get worse. Document Released: 09/16/2006 Document Revised: 07/29/2011 Document Reviewed: 11/17/2006 [facility] Patient Information 2014  [place], [place].   ________________________________________________________________________

## 2020-05-26 ENCOUNTER — Ambulatory Visit (INDEPENDENT_AMBULATORY_CARE_PROVIDER_SITE_OTHER): Payer: 59 | Admitting: Adult Health

## 2020-05-26 ENCOUNTER — Other Ambulatory Visit: Payer: Self-pay

## 2020-05-26 ENCOUNTER — Encounter: Payer: Self-pay | Admitting: Adult Health

## 2020-05-26 VITALS — BP 132/76 | HR 66 | Ht 69.0 in | Wt 234.0 lb

## 2020-05-26 DIAGNOSIS — I10 Essential (primary) hypertension: Secondary | ICD-10-CM

## 2020-05-26 DIAGNOSIS — E782 Mixed hyperlipidemia: Secondary | ICD-10-CM

## 2020-05-26 DIAGNOSIS — I480 Paroxysmal atrial fibrillation: Secondary | ICD-10-CM

## 2020-05-26 NOTE — Patient Instructions (Signed)
Medication Instructions:  Your physician recommends that you continue on your current medications as directed. Please refer to the Current Medication list given to you today.  *If you need a refill on your cardiac medications before your next appointment, please call your pharmacy*    Follow-Up: At [facility], you and your health needs are our priority.  As part of our continuing mission to provide you with exceptional heart care, we have created designated Provider Care Teams.  These Care Teams include your primary Cardiologist (physician) and Advanced Practice Providers (APPs -  Physician Assistants and Nurse Practitioners) who all work together to provide you with the care you need, when you need it.  We recommend signing up for the patient portal called "MyChart".  Sign up information is provided on this After Visit Summary.  MyChart is used to connect with patients for Virtual Visits (Telemedicine).  Patients are able to view lab/test results, encounter notes, upcoming appointments, etc.  Non-urgent messages can be sent to your provider as well.   To learn more about what you can do with MyChart, go to [email or web address].    Your next appointment:   6 month(s)  The format for your next appointment:   In Person  Provider:   Nanetta Batty, MD   Other Instructions Cleared for surgery on 06/01/20 with Dr. Orpah Clinton.

## 2020-05-30 ENCOUNTER — Other Ambulatory Visit (HOSPITAL_COMMUNITY)
Admission: RE | Admit: 2020-05-30 | Discharge: 2020-05-30 | Disposition: A | Discharge status: Home / Self Care | Payer: 59 | Source: Ambulatory Visit | Attending: Orthopedic Surgery | Admitting: Orthopedic Surgery

## 2020-05-30 ENCOUNTER — Other Ambulatory Visit: Payer: Self-pay

## 2020-05-30 ENCOUNTER — Encounter (HOSPITAL_COMMUNITY)
Admission: RE | Admit: 2020-05-30 | Discharge: 2020-05-30 | Disposition: A | Discharge status: Home / Self Care | Payer: 59 | Source: Ambulatory Visit | Attending: Orthopedic Surgery | Admitting: Orthopedic Surgery

## 2020-05-30 ENCOUNTER — Encounter (INDEPENDENT_AMBULATORY_CARE_PROVIDER_SITE_OTHER): Payer: Self-pay

## 2020-05-30 ENCOUNTER — Encounter (HOSPITAL_COMMUNITY): Payer: Self-pay

## 2020-05-30 DIAGNOSIS — Z20822 Contact with and (suspected) exposure to covid-19: Secondary | ICD-10-CM | POA: Insufficient documentation

## 2020-05-30 DIAGNOSIS — Z01812 Encounter for preprocedural laboratory examination: Secondary | ICD-10-CM | POA: Insufficient documentation

## 2020-05-30 DIAGNOSIS — Z01818 Encounter for other preprocedural examination: Secondary | ICD-10-CM | POA: Diagnosis not present

## 2020-05-30 HISTORY — DX: Family history of other specified conditions: Z84.89

## 2020-05-30 HISTORY — DX: Cardiac arrhythmia, unspecified: I49.9

## 2020-05-30 LAB — CBC
HCT: 44.3 % (ref 39.0–52.0)
Hemoglobin: 14.5 g/dL (ref 13.0–17.0)
MCH: 31.7 pg (ref 26.0–34.0)
MCHC: 32.7 g/dL (ref 30.0–36.0)
MCV: 96.7 fL (ref 80.0–100.0)
Platelets: 213 10*3/uL (ref 150–400)
RBC: 4.58 MIL/uL (ref 4.22–5.81)
RDW: 11.9 % (ref 11.5–15.5)
WBC: 6.3 10*3/uL (ref 4.0–10.5)
nRBC: 0 % (ref 0.0–0.2)

## 2020-05-30 LAB — SURGICAL PCR SCREEN
MRSA, PCR: NEGATIVE
Staphylococcus aureus: NEGATIVE

## 2020-05-30 LAB — SARS CORONAVIRUS 2 (TAT 6-24 HRS): SARS Coronavirus 2: NEGATIVE

## 2020-05-30 NOTE — Progress Notes (Signed)
COVID Vaccine Completed:no Date COVID Vaccine completed: COVID vaccine manufacturer: Pfizer    [employer]   PCP - Dr. Excell Seltzer Cardiologist - no  Chest x-ray - no EKG - 07/23/19 Stress Test - no ECHO - no Cardiac Cath - 2006, 2014 Pacemaker/ICD device last checked:NA  Sleep Study - no CPAP -   Fasting Blood Sugar - NA Checks Blood Sugar _____ times a day  Blood Thinner Instructions:NAAspirin Instructions: Last Dose:  Anesthesia review:   Patient denies shortness of breath, fever, cough and chest pain at PAT appointment yes  Patient verbalized understanding of instructions that were given to them at the PAT appointment. 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 No SOB

## 2020-05-30 NOTE — H&P (Signed)
UNICOMPARTMENTAL KNEE ADMISSION H&P  Patient is being admitted for left medial unicompartmental knee arthroplasty.  Subjective:  Chief Complaint:  Left knee medial compartmental primary OA /pain    HPI: Jimmy Baldwin, 50 y.o. male , has a history of pain and functional disability in the left and has failed non-surgical conservative treatments for greater than 12 weeks to include NSAID's and/or analgesics, corticosteriod injections and activity modification.  Onset of symptoms was gradual, starting 1+ years ago with gradually worsening course since that time. The patient noted prior procedures on the knee to include  arthroscopy and meniscal repair on the left knee(s).  Patient currently rates pain in the left knee(s) at 9 out of 10 with activity. Patient has night pain, worsening of pain with activity and weight bearing, pain that interferes with activities of daily living, pain with passive range of motion, crepitus and joint swelling.  Patient has evidence of periarticular osteophytes and joint space narrowing of the medial compartment by imaging studies.  There is no active infection.  Risks, benefits and expectations were discussed with the patient.  Risks including but not limited to the risk of anesthesia, blood clots, nerve damage, blood vessel damage, failure of the prosthesis, infection and up to and including death.  Patient understand the risks, benefits and expectations and wishes to proceed with surgery.   D/C Plans:       Home (SD)  Post-op Meds:       Rx given for ASA, Robaxin, Norco, Celebrex, Zofran, Iron, Colace and MiraLax  Tranexamic Acid:      To be given - IV   Decadron:      Is to be given  FYI:      ASA  Norco  Zofran Rx  DME:   Pt already has equipment  PT:   OPPT   Pharmacy: [facility] [address]   Past Medical History:  Diagnosis Date  . Cervical disc disease    per MRI july 2011  . DJD (degenerative joint disease)   . Dysrhythmia     a-fib  . Family history of adverse reaction to anesthesia    motheer , N&V  . GERD (gastroesophageal reflux disease)    occ tums  . History of stress test 03-07-2011   EF 56% Normal pattern of perfusion in all regions, low risk scan compared to previous study  . Hyperlipidemia 06/20/2011  . Insomnia 01/11/2016  . PAF (paroxysmal atrial fibrillation) (HCC) 2014  . PONV (postoperative nausea and vomiting)      Past Surgical History:  Procedure Laterality Date  . CARDIAC CATHETERIZATION  06-01-2004   normal cath; chest pain considered to be noncardiac  . CARPAL TUNNEL RELEASE  01/17/2012   Procedure: CARPAL TUNNEL RELEASE;  Surgeon: Nicki Reaper, MD;  Location: [facility];  Service: Orthopedics;  Laterality: Left;  left carpal tunnel release  . CARPAL TUNNEL RELEASE  02/21/2012   Procedure: CARPAL TUNNEL RELEASE;  Surgeon: Nicki Reaper, MD;  Location: [facility];  Service: Orthopedics;  Laterality: Right;  . KNEE ARTHROSCOPY     lt   . LEFT HEART CATHETERIZATION WITH CORONARY ANGIOGRAM N/A 03/19/2013   Procedure: LEFT HEART CATHETERIZATION WITH CORONARY ANGIOGRAM;  Surgeon: Marykay Lex, MD;  Location: [facility] CATH LAB;  Service: Cardiovascular;  Laterality: N/A;  . left shoulder surgury     x 3  . right knee surgury      x 4  -  twice with ACL tear  . right shoudler surgury     x 1    Allergies  Allergen Reactions  . Crestor [Rosuvastatin Calcium]     Joint/muscle aching  . Lipitor [Atorvastatin]     Joint/muscle aching    Social History   Tobacco Use  . Smoking status: Former Smoker    Quit date: 01/16/2003    Years since quitting: 17.3  . Smokeless tobacco: Never Used  Vaping Use  . Vaping Use: Never used  Substance Use Topics  . Alcohol use: No  . Drug use: No    Family History  Problem Relation Age of Onset  . Heart disease Other   . Hypertension Other   . Diabetes Other   . Heart disease Mother   . Heart attack Father 69        cabg  . Healthy Sister   . Healthy Sister      Review of Systems  Constitutional: Negative.   HENT: Negative.   Eyes: Negative.   Respiratory: Negative.   Cardiovascular: Negative.   Gastrointestinal: Positive for heartburn.  Genitourinary: Negative.   Musculoskeletal: Positive for joint pain.  Skin: Negative.   Neurological: Negative.   Endo/Heme/Allergies: Negative.   Psychiatric/Behavioral: Negative.      Objective:   Physical Exam Constitutional:      Appearance: He is well-developed.  HENT:     Head: Normocephalic.  Eyes:     Pupils: Pupils are equal, round, and reactive to light.  Neck:     Thyroid: No thyromegaly.     Vascular: No JVD.     Trachea: No tracheal deviation.  Cardiovascular:     Rate and Rhythm: Normal rate and regular rhythm.     Pulses: Intact distal pulses.  Pulmonary:     Effort: Pulmonary effort is normal. No respiratory distress.     Breath sounds: Normal breath sounds. No wheezing.  Abdominal:     Palpations: Abdomen is soft.     Tenderness: There is no abdominal tenderness. There is no guarding.  Musculoskeletal:     Cervical back: Neck supple.     Left knee: Swelling and bony tenderness present. No erythema or ecchymosis. Decreased range of motion. Tenderness present over the medial joint line.  Lymphadenopathy:     Cervical: No cervical adenopathy.  Skin:    General: Skin is warm and dry.  Neurological:     Mental Status: He is alert and oriented to person, place, and time.  Psychiatric:        Mood and Affect: Mood and affect normal.        Labs:  Estimated body mass index is 34.41 kg/m as calculated from the following:   Height as of 05/30/20: 5\' 9"  (1.753 m).   Weight as of 05/30/20: 105.7 kg.   Imaging Review Plain radiographs demonstrate severe degenerative joint disease of the left knee(s) medial compartment.  The bone quality appears to be good for age and reported activity level.  Assessment/Plan:  End  stage arthritis, left knee medial compartment  The patient history, physical examination, clinical judgment of the provider and imaging studies are consistent with end stage degenerative joint disease of the left knee(s) and medial unicompartmental knee arthroplasty is deemed medically necessary. The treatment options including medical management, injection therapy arthroscopy and arthroplasty were discussed at length. The risks and benefits of total knee arthroplasty were presented and reviewed. The risks due to aseptic loosening, infection, stiffness, patella tracking problems, thromboembolic complications and  other imponderables were discussed. The patient acknowledged the explanation, agreed to proceed with the plan and consent was signed. Patient is being admitted for outpatient / observation treatment for surgery, pain control, PT, OT, prophylactic antibiotics, VTE prophylaxis, progressive ambulation and ADL's and discharge planning. The patient is planning to be discharged home.     Anastasio Auerbach Matilde Markie   PA-C  05/30/2020, 9:26 PM

## 2020-06-01 ENCOUNTER — Ambulatory Visit (HOSPITAL_COMMUNITY)
Admission: RE | Admit: 2020-06-01 | Discharge: 2020-06-01 | Disposition: A | Discharge status: Home / Self Care | Payer: 59 | Attending: Orthopedic Surgery | Admitting: Orthopedic Surgery

## 2020-06-01 ENCOUNTER — Other Ambulatory Visit: Payer: Self-pay

## 2020-06-01 ENCOUNTER — Ambulatory Visit (HOSPITAL_COMMUNITY): Payer: 59 | Admitting: Anesthesiology

## 2020-06-01 ENCOUNTER — Encounter (HOSPITAL_COMMUNITY)
Admission: RE | Disposition: A | Discharge status: Home / Self Care | Payer: Self-pay | Source: Home / Self Care | Attending: Orthopedic Surgery

## 2020-06-01 ENCOUNTER — Encounter (HOSPITAL_COMMUNITY): Payer: Self-pay | Admitting: Orthopedic Surgery

## 2020-06-01 DIAGNOSIS — M25762 Osteophyte, left knee: Secondary | ICD-10-CM | POA: Insufficient documentation

## 2020-06-01 DIAGNOSIS — Z87891 Personal history of nicotine dependence: Secondary | ICD-10-CM | POA: Diagnosis not present

## 2020-06-01 DIAGNOSIS — Z888 Allergy status to other drugs, medicaments and biological substances status: Secondary | ICD-10-CM | POA: Diagnosis not present

## 2020-06-01 DIAGNOSIS — M1712 Unilateral primary osteoarthritis, left knee: Secondary | ICD-10-CM | POA: Insufficient documentation

## 2020-06-01 HISTORY — PX: PARTIAL KNEE ARTHROPLASTY: SHX2174

## 2020-06-01 LAB — TYPE AND SCREEN
ABO/RH(D): A POS
Antibody Screen: NEGATIVE

## 2020-06-01 LAB — ABO/RH: ABO/RH(D): A POS

## 2020-06-01 SURGERY — ARTHROPLASTY, KNEE, UNICOMPARTMENTAL
Anesthesia: Regional | Site: Knee | Laterality: Left

## 2020-06-01 MED ORDER — KETOROLAC TROMETHAMINE 30 MG/ML IJ SOLN
INTRAMUSCULAR | Status: DC | PRN
  Administered 2020-06-01: 30 mg

## 2020-06-01 MED ORDER — PHENYLEPHRINE HCL-NACL 10-0.9 MG/250ML-% IV SOLN
INTRAVENOUS | Status: DC | PRN
  Administered 2020-06-01: 50 ug/min via INTRAVENOUS

## 2020-06-01 MED ORDER — KETOROLAC TROMETHAMINE 30 MG/ML IJ SOLN
INTRAMUSCULAR | Status: AC
  Filled 2020-06-01: qty 1

## 2020-06-01 MED ORDER — SODIUM CHLORIDE (PF) 0.9 % IJ SOLN
INTRAMUSCULAR | Status: AC
  Filled 2020-06-01: qty 30

## 2020-06-01 MED ORDER — BUPIVACAINE-EPINEPHRINE 0.25% -1:200000 IJ SOLN
INTRAMUSCULAR | Status: DC | PRN
  Administered 2020-06-01: 30 mL

## 2020-06-01 MED ORDER — LACTATED RINGERS IV SOLN: INTRAVENOUS | Status: DC

## 2020-06-01 MED ORDER — ONDANSETRON HCL 4 MG/2ML IJ SOLN
INTRAMUSCULAR | Status: DC | PRN
  Administered 2020-06-01: 4 mg via INTRAVENOUS

## 2020-06-01 MED ORDER — BUPIVACAINE-EPINEPHRINE (PF) 0.25% -1:200000 IJ SOLN
INTRAMUSCULAR | Status: AC
  Filled 2020-06-01: qty 30

## 2020-06-01 MED ORDER — TRANEXAMIC ACID-NACL 1000-0.7 MG/100ML-% IV SOLN: 1000.0000 mg | Freq: Once | INTRAVENOUS | Status: DC

## 2020-06-01 MED ORDER — HYDROCODONE-ACETAMINOPHEN 7.5-325 MG PO TABS
1.0000 | ORAL_TABLET | ORAL | 0 refills | Status: DC | PRN
Start: 2020-06-01 — End: 2021-04-03

## 2020-06-01 MED ORDER — FENTANYL CITRATE (PF) 100 MCG/2ML IJ SOLN: 25.0000 ug | INTRAMUSCULAR | Status: DC | PRN

## 2020-06-01 MED ORDER — CEFAZOLIN SODIUM-DEXTROSE 2-4 GM/100ML-% IV SOLN
2.0000 g | INTRAVENOUS | Status: AC
  Administered 2020-06-01: 2 g via INTRAVENOUS
  Filled 2020-06-01: qty 100

## 2020-06-01 MED ORDER — MIDAZOLAM HCL 2 MG/2ML IJ SOLN
1.0000 mg | INTRAMUSCULAR | Status: DC
  Administered 2020-06-01: 2 mg via INTRAVENOUS
  Filled 2020-06-01: qty 2

## 2020-06-01 MED ORDER — LACTATED RINGERS IV BOLUS
250.0000 mL | Freq: Once | INTRAVENOUS | Status: AC
  Administered 2020-06-01: 250 mL via INTRAVENOUS

## 2020-06-01 MED ORDER — TRANEXAMIC ACID-NACL 1000-0.7 MG/100ML-% IV SOLN
INTRAVENOUS | Status: AC
  Administered 2020-06-01: 1000 mg
  Filled 2020-06-01: qty 100

## 2020-06-01 MED ORDER — POVIDONE-IODINE 10 % EX SWAB
2.0000 "application " | Freq: Once | CUTANEOUS | Status: AC
  Administered 2020-06-01: 2 via TOPICAL

## 2020-06-01 MED ORDER — ASPIRIN 81 MG PO CHEW: 81.0000 mg | CHEWABLE_TABLET | Freq: Two times a day (BID) | ORAL | 0 refills | Status: AC

## 2020-06-01 MED ORDER — POLYETHYLENE GLYCOL 3350 17 G PO PACK: 17.0000 g | PACK | Freq: Two times a day (BID) | ORAL | 0 refills | Status: AC

## 2020-06-01 MED ORDER — METHOCARBAMOL 500 MG IVPB - SIMPLE MED: 500.0000 mg | Freq: Four times a day (QID) | INTRAVENOUS | Status: DC | PRN

## 2020-06-01 MED ORDER — DOCUSATE SODIUM 100 MG PO CAPS: 100.0000 mg | ORAL_CAPSULE | Freq: Two times a day (BID) | ORAL | 0 refills | Status: DC

## 2020-06-01 MED ORDER — ACETAMINOPHEN 500 MG PO TABS
1000.0000 mg | ORAL_TABLET | Freq: Once | ORAL | Status: AC
  Administered 2020-06-01: 1000 mg via ORAL
  Filled 2020-06-01: qty 2

## 2020-06-01 MED ORDER — DEXAMETHASONE SODIUM PHOSPHATE 10 MG/ML IJ SOLN: 10.0000 mg | Freq: Once | INTRAMUSCULAR | Status: DC

## 2020-06-01 MED ORDER — DEXAMETHASONE SODIUM PHOSPHATE 10 MG/ML IJ SOLN
INTRAMUSCULAR | Status: DC | PRN
  Administered 2020-06-01: 5 mg
  Administered 2020-06-01: 4 mg

## 2020-06-01 MED ORDER — METHOCARBAMOL 500 MG PO TABS: 500.0000 mg | ORAL_TABLET | Freq: Four times a day (QID) | ORAL | 0 refills | Status: DC | PRN

## 2020-06-01 MED ORDER — FERROUS SULFATE 325 (65 FE) MG PO TABS: 325.0000 mg | ORAL_TABLET | Freq: Three times a day (TID) | ORAL | 0 refills | Status: DC

## 2020-06-01 MED ORDER — CELECOXIB 200 MG PO CAPS: 200.0000 mg | ORAL_CAPSULE | Freq: Two times a day (BID) | ORAL | 0 refills | Status: DC

## 2020-06-01 MED ORDER — ORAL CARE MOUTH RINSE: 15.0000 mL | Freq: Once | OROMUCOSAL | Status: AC

## 2020-06-01 MED ORDER — STERILE WATER FOR IRRIGATION IR SOLN
Status: DC | PRN
  Administered 2020-06-01: 2000 mL

## 2020-06-01 MED ORDER — HYDROCODONE-ACETAMINOPHEN 5-325 MG PO TABS: 1.0000 | ORAL_TABLET | ORAL | Status: DC | PRN

## 2020-06-01 MED ORDER — LACTATED RINGERS IV BOLUS
500.0000 mL | Freq: Once | INTRAVENOUS | Status: AC
  Administered 2020-06-01: 500 mL via INTRAVENOUS

## 2020-06-01 MED ORDER — CHLORHEXIDINE GLUCONATE 0.12 % MT SOLN
15.0000 mL | Freq: Once | OROMUCOSAL | Status: AC
  Administered 2020-06-01: 15 mL via OROMUCOSAL

## 2020-06-01 MED ORDER — FENTANYL CITRATE (PF) 100 MCG/2ML IJ SOLN
50.0000 ug | INTRAMUSCULAR | Status: DC
  Administered 2020-06-01 (×2): 50 ug via INTRAVENOUS
  Filled 2020-06-01: qty 2

## 2020-06-01 MED ORDER — TRANEXAMIC ACID-NACL 1000-0.7 MG/100ML-% IV SOLN
1000.0000 mg | INTRAVENOUS | Status: AC
Start: 2020-06-01 — End: 2020-06-01
  Administered 2020-06-01: 1000 mg via INTRAVENOUS
  Filled 2020-06-01: qty 100

## 2020-06-01 MED ORDER — ROPIVACAINE HCL 5 MG/ML IJ SOLN
INTRAMUSCULAR | Status: DC | PRN
  Administered 2020-06-01: 20 mL via PERINEURAL

## 2020-06-01 MED ORDER — CEFAZOLIN SODIUM-DEXTROSE 2-4 GM/100ML-% IV SOLN: 2.0000 g | Freq: Four times a day (QID) | INTRAVENOUS | Status: DC

## 2020-06-01 MED ORDER — MEPIVACAINE HCL (PF) 2 % IJ SOLN
INTRAMUSCULAR | Status: DC | PRN
  Administered 2020-06-01: 2.5 mL via EPIDURAL

## 2020-06-01 MED ORDER — SODIUM CHLORIDE (PF) 0.9 % IJ SOLN
INTRAMUSCULAR | Status: DC | PRN
  Administered 2020-06-01: 30 mL

## 2020-06-01 MED ORDER — APREPITANT 40 MG PO CAPS
ORAL_CAPSULE | ORAL | Status: AC
  Filled 2020-06-01: qty 1

## 2020-06-01 MED ORDER — PROPOFOL 10 MG/ML IV BOLUS
INTRAVENOUS | Status: DC | PRN
  Administered 2020-06-01 (×2): 40 mg via INTRAVENOUS

## 2020-06-01 MED ORDER — PROPOFOL 500 MG/50ML IV EMUL
INTRAVENOUS | Status: DC | PRN
  Administered 2020-06-01: 60 ug/kg/min via INTRAVENOUS

## 2020-06-01 MED ORDER — HYDROCODONE-ACETAMINOPHEN 7.5-325 MG PO TABS
ORAL_TABLET | ORAL | Status: AC
  Filled 2020-06-01: qty 2

## 2020-06-01 MED ORDER — ONDANSETRON HCL 4 MG PO TABS: 4.0000 mg | ORAL_TABLET | Freq: Three times a day (TID) | ORAL | 0 refills | Status: DC | PRN

## 2020-06-01 MED ORDER — HYDROCODONE-ACETAMINOPHEN 7.5-325 MG PO TABS
1.0000 | ORAL_TABLET | ORAL | Status: DC | PRN
  Administered 2020-06-01: 2 via ORAL

## 2020-06-01 MED ORDER — METHOCARBAMOL 500 MG PO TABS: 500.0000 mg | ORAL_TABLET | Freq: Four times a day (QID) | ORAL | Status: DC | PRN

## 2020-06-01 MED ORDER — 0.9 % SODIUM CHLORIDE (POUR BTL) OPTIME
TOPICAL | Status: DC | PRN
Start: 2020-06-01 — End: 2020-06-01
  Administered 2020-06-01: 1000 mL

## 2020-06-01 SURGICAL SUPPLY — 53 items
ADH SKN CLS APL DERMABOND .7 (GAUZE/BANDAGES/DRESSINGS) ×1
BAG SPEC THK2 15X12 ZIP CLS (MISCELLANEOUS)
BAG ZIPLOCK 12X15 (MISCELLANEOUS) IMPLANT
BEARING MENISCAL TIBIAL 5 MD L (Orthopedic Implant) ×1 IMPLANT
BLADE SAW RECIPROCATING 77.5 (BLADE) ×2 IMPLANT
BLADE SAW SGTL 13.0X1.19X90.0M (BLADE) ×2 IMPLANT
BNDG CMPR MED 10X6 ELC LF (GAUZE/BANDAGES/DRESSINGS) ×1
BNDG ELASTIC 6X10 VLCR STRL LF (GAUZE/BANDAGES/DRESSINGS) ×1 IMPLANT
BNDG ELASTIC 6X5.8 VLCR STR LF (GAUZE/BANDAGES/DRESSINGS) ×2 IMPLANT
BOWL SMART MIX CTS (DISPOSABLE) ×2 IMPLANT
BRNG TIB B UNCMP STRL LM/RL (Joint) ×1 IMPLANT
BRNG TIB MED 5 PHS 3 LT MEN (Orthopedic Implant) ×1 IMPLANT
CEMENT BONE R 1X40 (Cement) ×2 IMPLANT
COVER SURGICAL LIGHT HANDLE (MISCELLANEOUS) ×2 IMPLANT
COVER WAND RF STERILE (DRAPES) IMPLANT
CUFF TOURN SGL QUICK 34 (TOURNIQUET CUFF) ×2
CUFF TRNQT CYL 34X4.125X (TOURNIQUET CUFF) ×1 IMPLANT
DECANTER SPIKE VIAL GLASS SM (MISCELLANEOUS) ×4 IMPLANT
DERMABOND ADVANCED (GAUZE/BANDAGES/DRESSINGS) ×1
DERMABOND ADVANCED .7 DNX12 (GAUZE/BANDAGES/DRESSINGS) ×1 IMPLANT
DRAPE U-SHAPE 47X51 STRL (DRAPES) ×2 IMPLANT
DRESSING AQUACEL AG SP 3.5X10 (GAUZE/BANDAGES/DRESSINGS) ×1 IMPLANT
DRSG AQUACEL AG ADV 3.5X10 (GAUZE/BANDAGES/DRESSINGS) ×1 IMPLANT
DRSG AQUACEL AG SP 3.5X10 (GAUZE/BANDAGES/DRESSINGS) ×2
DURAPREP 26ML APPLICATOR (WOUND CARE) ×2 IMPLANT
ELECT REM PT RETURN 15FT ADLT (MISCELLANEOUS) ×2 IMPLANT
GLOVE BIOGEL PI IND STRL 8.5 (GLOVE) ×1 IMPLANT
GLOVE BIOGEL PI INDICATOR 8.5 (GLOVE) ×1
GLOVE ECLIPSE 8.0 STRL XLNG CF (GLOVE) ×2 IMPLANT
GLOVE ORTHO TXT STRL SZ7.5 (GLOVE) ×4 IMPLANT
GLOVE SURG UNDER POLY LF SZ7.5 (GLOVE) ×2 IMPLANT
GOWN STRL REUS W/TWL 2XL LVL3 (GOWN DISPOSABLE) ×2 IMPLANT
GOWN STRL REUS W/TWL LRG LVL3 (GOWN DISPOSABLE) ×2 IMPLANT
HOLDER FOLEY CATH W/STRAP (MISCELLANEOUS) IMPLANT
INSERT TIBIAL OXFORD SZ B LF (Joint) ×1 IMPLANT
KIT TURNOVER KIT A (KITS) IMPLANT
LEGGING LITHOTOMY PAIR STRL (DRAPES) ×2 IMPLANT
MANIFOLD NEPTUNE II (INSTRUMENTS) ×2 IMPLANT
NDL SAFETY ECLIPSE 18X1.5 (NEEDLE) ×1 IMPLANT
NEEDLE HYPO 18GX1.5 SHARP (NEEDLE) ×2
PACK BLADE SAW RECIP 70 3 PT (BLADE) ×1 IMPLANT
PACK ICE MAXI GEL EZY WRAP (MISCELLANEOUS) ×2 IMPLANT
PACK TOTAL KNEE CUSTOM (KITS) ×2 IMPLANT
PEG TWIN FEM CEMENTED MED (Knees) ×1 IMPLANT
PENCIL SMOKE EVACUATOR (MISCELLANEOUS) ×1 IMPLANT
SUT MNCRL AB 4-0 PS2 18 (SUTURE) ×2 IMPLANT
SUT STRATAFIX PDS+ 0 24IN (SUTURE) ×2 IMPLANT
SUT VIC AB 1 CT1 36 (SUTURE) ×3 IMPLANT
SUT VIC AB 2-0 CT1 27 (SUTURE) ×4
SUT VIC AB 2-0 CT1 TAPERPNT 27 (SUTURE) ×2 IMPLANT
SYR 3ML LL SCALE MARK (SYRINGE) ×2 IMPLANT
TRAY FOLEY MTR SLVR 16FR STAT (SET/KITS/TRAYS/PACK) ×2 IMPLANT
WRAP KNEE MAXI GEL POST OP (GAUZE/BANDAGES/DRESSINGS) ×1 IMPLANT

## 2020-06-01 NOTE — Discharge Instructions (Signed)

## 2020-06-01 NOTE — Anesthesia Procedure Notes (Signed)
Spinal  Patient location during procedure: OR Start time: 06/01/2020 10:58 AM End time: 06/01/2020 11:03 AM Staffing Performed: resident/CRNA  Resident/CRNA: Ezekiel Ina, CRNA Preanesthetic Checklist Completed: patient identified, IV checked, site marked, risks and benefits discussed, surgical consent, monitors and equipment checked, pre-op evaluation and timeout performed Spinal Block Patient position: sitting Prep: ChloraPrep Patient monitoring: heart rate, cardiac monitor, continuous pulse ox and blood pressure Approach: midline Location: L3-4 Injection technique: single-shot Needle Needle type: Sprotte  Needle gauge: 24 G Needle length: 9 cm Assessment Sensory level: T8 Additional Notes Functioning IV was confirmed and monitors were applied. Sterile prep and drape, including hand hygiene and sterile gloves were used. The patient was positioned and the spine was prepped. The skin was anesthetized with lidocaine.  Free flow of clear CSF was obtained prior to injecting local anesthetic into the CSF x 1 attempt.  The spinal needle aspirated freely following injection.  The needle was carefully withdrawn.  The patient tolerated the procedure well.

## 2020-06-01 NOTE — Op Note (Signed)
NAME: Jimmy Baldwin    MEDICAL RECORD NO.: [id number]   FACILITY: [facility]   DATE OF BIRTH: [birth year]  PHYSICIAN: Madlyn Frankel. Charlann Boxer, M.D.    DATE OF PROCEDURE: 06/01/2020    OPERATIVE REPORT   PREOPERATIVE DIAGNOSIS: left knee medial compartment osteoarthritis.   POSTOPERATIVE DIAGNOSIS: left knee medial compartment osteoarthritis.   PROCEDURE: left partial knee replacement utilizing Biomet Oxford knee  component, size medium femur, a left medial size B tibial tray with a 5 mm insert.   SURGEON: Madlyn Frankel. Charlann Boxer, M.D.   ASSISTANT: Lanney Gins, PAC.  Please note that Mr. Jimmy Baldwin was present for the entirety of the case,  utilized for preoperative positioning, perioperative retractor  management, general facilitation of the case and primary wound closure.   ANESTHESIA: regional and spinal.   SPECIMENS: None.   COMPLICATIONS: None.  DRAINS: None   TOURNIQUET TIME: 34 minutes at 250 mmHg.   INDICATIONS FOR PROCEDURE: The patient is a 50 y.o. patient of mine who presented for evaluation of left knee pain.  They presented with primary complaints of pain on the medial side of their knee. Radiographs revealed advanced medial compartment arthritis with specifically an antero-medial wear pattern.  There was bone on bone changes noted with subchondral sclerosis and osteophytes present. The patient has had progressive problems failing to respond to conservative measures of medications, injections and activity modification. Risks of infection, DVT, component failure, need for future revision surgery were all discussed and reviewed.  Consent was obtained for benefit of pain relief.   PROCEDURE IN DETAIL: The patient was brought to the operative theater.  Once adequate anesthesia, preoperative antibiotics, 2 gm of Ancef administered, 1 gm of Tranexamic Acid, and 10 mg of Decadron given the patient was positioned in supine position with a left thigh tourniquet placed. The operative leg was  positioned over the Oxford leg holder such to allow for 120 degrees of flexion. The left lower extremity was prepped and draped in sterile fashion. A time-out  was performed identifying the patient, planned procedure, and extremity.  The operative leg was exsanguinated and the tourniquet elevated to 250 mmHg. A midline  incision was made from the proximal pole of the patella to the tibial tubercle. A  soft tissue plane was created and partial median arthrotomy was then  made to allow for subluxation of the patella. Following initial synovectomy and  debridement, the osteophytes were removed off the medial aspect of the knee and within the notch as needed.  The femur was first sized using the sizing spoons and determine to be a medium femur.  The femoral spoon was then left in place to be attached to the extra medullary tibial guide.  The tibial extramedullary guide was positioned over the anterior crest of the tibia  and pinned into position, perpendicular in the coronal plane with appropriate slope.  Utilizing the size 4 G clamp I connected the cutting block tray to the femoral spine.  Upon confirming the orientation of the cutting block as it pertained to the extra medullary guide the tibial tray was pinned in place.  I then used a reciprocating saw along the medial border of the medial tibial spine.  The reciprocating saw was then used to complete the proximal tibial osteotomy.  The proximal tibial bone was removed and identified to have complete loss of cartilage and anterior medial pattern.  The cut surface of the tibia was found to be best covered with the B tibial tray.  At this point  I checked the tension of the medial collateral ligament at 90 degrees.  With the retractors out of the wound and the knee held at 90 degrees the 4 feeler gauge had appropriate tension on the medial ligament.   At this point, the femoral canal was opened with a 6 mm drill followed by the starting awl and then the   intramedullary was positioned within the canal.  Then the medium posterior cutting block guide set at 4 to match the flexion gap was positioned onto the cut surface of the tibia and then linked to the intramedullary rod using the articulating link.  This posterior cutting block guide was positioned over the middle portion of the medial femoral condyle in line with the normal kinematic motion of the knee.  The 2 drill holes were then made into the distal femur.  Once this was done the posterior cutting block guide and length were removed.    The posterior guide was then impacted into place on the distal femur and the posterior  femoral cut made.   At this point, based on the tension on the medial collateral ligament at 90 degrees as previously assessed I selected the size 4 spigot and pushed it to the distal femur.  I then milled the distal femur.  Excess of bone from the distal femur and then medially and laterally were removed using osteotome and rondure.  We now did our first trial reduction with the medium femur and the B tibial tray.  I now assessed the ligament balancing at 90 and 20 degrees of flexion.     I found that the tension at 90 degrees was appropriate with the 5 feeler gauge but found that the tension at 20 degrees matched the tension of the medial collateral ligament with the 4 feeler gauge.  Per the protocol based on the mismatch of tensioning at 90 and 20 degrees of flexion I placed the 5 spigot and re-milled the distal femur.  Once the remaining bone off the distal femur was removed a repeat trial reduction was carried out.  At this point I found that the medial collateral ligament tension was symmetric at 90 and 20 degrees of flexion with the 5 feeler gauge.    Given these findings, the trial femoral component was removed. Final preparation of tibia was carried out by pinning it in position. Then  using a reciprocating saw I removed bone for the keel. Further bone was  removed with an  osteotome.  Trial reduction was now carried out with the medium femur, the left medial keeled B tibia, and the 5 lollipop insert. The balance of the  ligaments appeared to be symmetric at 20 degrees and 90 degrees. Given  all these findings, the trial components were removed.  The final components were opened. The knee was irrigated with  normal saline solution.  The medial synovial capsule junction of the knee was injected with 30 cc of quarter percent Marcaine with epinephrine, 1 cc of Toradol, and 30 cc of normal saline.  Any final debridements of the soft tissue or osteophytes was carried out at this point.  Sclerotic bone on the distal femur and proximal tibia were drilled with the drill and the set.    The final components were cemented onto a clean and dried cut surfaces of bone. Excessive cement was removed and the knee was then held at 45 degrees of flexion with the size 5 feeler gauge until the cement cured. Excess cement was removed throughout  the knee. Tourniquet was let down  after 34 minutes. After the cement had fully cured and excessive cement  was removed throughout the knee there was no visualized cement present.  Before selecting the final insert re-trialed with the 5 lollipop insert.  Given that these findings confirmed our initial trial reduction we selected the final size 5 mm, left medial insert to match the medium femur.  Following irrigation of the knee this final insert was snapped into place.   The extensor mechanism  was then reapproximated using a #1 Vicryl and #1 Stratafix suture with the knee in flexion. The  remaining wound was closed with 2-0 Vicryl and a running 4-0 Monocryl.  The knee was cleaned, dried, and dressed sterilely using Dermabond and  Aquacel dressing. The patient  was brought to the recovery room, Ace wrap in place, tolerating the  procedure well.     Madlyn Frankel Charlann Boxer, M.D.

## 2020-06-01 NOTE — Interval H&P Note (Signed)
History and Physical Interval Note:  06/01/2020 9:07 AM  Jimmy Baldwin  has presented today for surgery, with the diagnosis of Left knee osteoarthrotis medially.  The various methods of treatment have been discussed with the patient and family. After consideration of risks, benefits and other options for treatment, the patient has consented to  Procedure(s) with comments: UNICOMPARTMENTAL KNEE LEFT MEDIALLY (Left) - 90 MINS as a surgical intervention.  The patient's history has been reviewed, patient examined, no change in status, stable for surgery.  I have reviewed the patient's chart and labs.  Questions were answered to the patient's satisfaction.     Shelda Pal

## 2020-06-01 NOTE — Anesthesia Preprocedure Evaluation (Addendum)
Anesthesia Evaluation  Patient identified by MRN, date of birth, ID band Patient awake    Reviewed: Allergy & Precautions, NPO status , Patient's Chart, lab work & pertinent test results, reviewed documented beta blocker date and time   History of Anesthesia Complications (+) PONV  Airway Mallampati: III  TM Distance: >3 FB Neck ROM: Full  Mouth opening: Limited Mouth Opening  Dental no notable dental hx. (+) Teeth Intact, Dental Advisory Given   Pulmonary neg pulmonary ROS, former smoker,    Pulmonary exam normal breath sounds clear to auscultation       Cardiovascular Normal cardiovascular exam+ dysrhythmias Atrial Fibrillation  Rhythm:Regular Rate:Normal  HLD  LHC 2006 unremarkable   Neuro/Psych negative neurological ROS  negative psych ROS   GI/Hepatic Neg liver ROS, GERD  Controlled,  Endo/Other  negative endocrine ROS  Renal/GU negative Renal ROS  negative genitourinary   Musculoskeletal  (+) Arthritis ,   Abdominal   Peds  Hematology negative hematology ROS (+)   Anesthesia Other Findings   Reproductive/Obstetrics                            Anesthesia Physical Anesthesia Plan  ASA: III  Anesthesia Plan: Spinal and Regional   Post-op Pain Management:  Regional for Post-op pain   Induction:   PONV Risk Score and Plan: 2 and Treatment may vary due to age or medical condition, Propofol infusion, Midazolam, Ondansetron and Dexamethasone  Airway Management Planned: Natural Airway  Additional Equipment:   Intra-op Plan:   Post-operative Plan:   Informed Consent: I have reviewed the patients History and Physical, chart, labs and discussed the procedure including the risks, benefits and alternatives for the proposed anesthesia with the patient or authorized representative who has indicated his/her understanding and acceptance.     Dental advisory given  Plan Discussed  with: CRNA  Anesthesia Plan Comments:         Anesthesia Quick Evaluation

## 2020-06-01 NOTE — Anesthesia Postprocedure Evaluation (Signed)
Anesthesia Post Note  Patient: Jimmy Baldwin  Procedure(s) Performed: UNICOMPARTMENTAL KNEE LEFT MEDIALLY (Left Knee)     Patient location during evaluation: PACU Anesthesia Type: Regional and Spinal Level of consciousness: oriented and awake and alert Pain management: pain level controlled Vital Signs Assessment: post-procedure vital signs reviewed and stable Respiratory status: spontaneous breathing, respiratory function stable and patient connected to nasal cannula oxygen Cardiovascular status: blood pressure returned to baseline and stable Postop Assessment: no headache, no backache and no apparent nausea or vomiting Anesthetic complications: no   No complications documented.  Last Vitals:  Vitals:   06/01/20 1243 06/01/20 1359  BP:    Pulse:  68  Resp:  (!) 33  Temp: 36.9 C 36.8 C  SpO2:  98%    Last Pain:  Vitals:   06/01/20 1359  TempSrc:   PainSc: 0-No pain                 Calyn Sivils L Ariona Deschene

## 2020-06-01 NOTE — Transfer of Care (Signed)
Immediate Anesthesia Transfer of Care Note  Patient: Jimmy Baldwin  Procedure(s) Performed: UNICOMPARTMENTAL KNEE LEFT MEDIALLY (Left Knee)  Patient Location: PACU  Anesthesia Type:Spinal  Level of Consciousness: awake  Airway & Oxygen Therapy: Patient Spontanous Breathing and Patient connected to face mask oxygen  Post-op Assessment: Report given to RN and Post -op Vital signs reviewed and stable  Post vital signs: Reviewed and stable  Last Vitals:  Vitals Value Taken Time  BP 112/76 06/01/20 1245  Temp    Pulse 73 06/01/20 1247  Resp 20 06/01/20 1247  SpO2 96 % 06/01/20 1247  Vitals shown include unvalidated device data.  Last Pain:  Vitals:   06/01/20 0936  TempSrc:   PainSc: 7       Patients Stated Pain Goal: 4 (06/01/20 0936)  Complications: No complications documented.

## 2020-06-01 NOTE — Evaluation (Signed)
Physical Therapy Evaluation Patient Details Name: Jimmy Baldwin MRN: [id number] DOB: 08-Sep-1970 Today's Date: 06/01/2020   History of Present Illness  Patient is 50 y.o. male s/p Lt UKA on 06/01/20 with PMH significant for PAF, HLD, GERD, A-fib.  Clinical Impression  Jimmy Baldwin is a 50 y.o. male POD 0 s/p Lt UKA. Patient reports independence with mobility at baseline. Patient is now limited by functional impairments (see PT problem list below) and requires min guard/supervision for transfers and gait with RW. Patient was able to ambulate ~150 feet with RW and supervision and cues for safe walker management. Patient educated on safe sequencing for stair mobility and verbalized safe guarding position for people assisting with mobility. Patient instructed in exercises to facilitate ROM and circulation. Patient will benefit from continued skilled PT interventions to address impairments and progress towards PLOF. Patient has met mobility goals at adequate level for discharge home; will continue to follow if pt continues acute stay to progress towards Mod I goals.     Follow Up Recommendations Follow surgeon's recommendation for DC plan and follow-up therapies;Outpatient PT    Equipment Recommendations  None recommended by PT    Recommendations for Other Services       Precautions / Restrictions Precautions Precautions: Fall Restrictions Weight Bearing Restrictions: No Other Position/Activity Restrictions: WBAT      Mobility  Bed Mobility Overal bed mobility: Needs Assistance Bed Mobility: Supine to Sit;Sit to Supine     Supine to sit: Supervision Sit to supine: Supervision   General bed mobility comments: HOB slightly elevated, no assist needed. pt taking slightly increased time to sit up EOB and to bring LE's back onto bed at EOS.    Transfers Overall transfer level: Needs assistance Equipment used: Rolling walker (2 wheeled) Transfers: Sit to/from Stand Sit to Stand:  Supervision         General transfer comment: no assist needed for rising and pt stable in standing. cues for safe hand placement RW.  Ambulation/Gait Ambulation/Gait assistance: Min guard;Supervision Gait Distance (Feet): 150 Feet Assistive device: Rolling walker (2 wheeled) Gait Pattern/deviations: Step-to pattern;Step-through pattern;Decreased stride length Gait velocity: fair   General Gait Details: cues for step to pattern and pt progressed to step through with no buckling at Lt knee and no overt LOB. pt maintain safe position to RW and pt's wife present and educated on safe guarding position for gait.  Stairs Stairs: Yes Stairs assistance: Min guard;Supervision Stair Management: One rail Left;No rails;Step to pattern;Forwards;With walker Number of Stairs: 6 (2x3) General stair comments: first bout with RW for stair mobility, cues for safe walker position and sequencing "up with good, down with bad". no overt LOB. pt usign Lt rail on second bout and step to pattern. educated pt/wife on safe guarding position for stairs and verbalized understanding.  Wheelchair Mobility    Modified Rankin (Stroke Patients Only)       Balance Overall balance assessment: Mild deficits observed, not formally tested                                           Pertinent Vitals/Pain Pain Assessment: 0-10 Pain Score: 2  Pain Location: Lt knee Pain Descriptors / Indicators: Discomfort Pain Intervention(s): Limited activity within patient's tolerance;Monitored during session;Repositioned;Premedicated before session    Home Living Family/patient expects to be discharged to:: Private residence Living Arrangements: Spouse/significant other;Children Available Help at  Discharge: Family Type of Home: House Home Access: Stairs to enter Entrance Stairs-Rails: None Entrance Stairs-Number of Steps: 2 Home Layout:  (split level) Home Equipment: Walker - 2 wheels;Cane - single  point;Shower seat;Bedside commode      Prior Function Level of Independence: Independent               Hand Dominance   Dominant Hand: Right    Extremity/Trunk Assessment   Upper Extremity Assessment Upper Extremity Assessment: Overall WFL for tasks assessed    Lower Extremity Assessment Lower Extremity Assessment: Overall WFL for tasks assessed;LLE deficits/detail LLE Deficits / Details: good quad activation, no extensor lag with SLR LLE Sensation: WNL LLE Coordination: WNL    Cervical / Trunk Assessment Cervical / Trunk Assessment: Normal  Communication   Communication: No difficulties  Cognition Arousal/Alertness: Awake/alert Behavior During Therapy: WFL for tasks assessed/performed Overall Cognitive Status: Within Functional Limits for tasks assessed                                        General Comments      Exercises Total Joint Exercises Ankle Circles/Pumps: AROM;Both;10 reps;Supine Quad Sets: AROM;Left;Supine;Other reps (comment) (3) Short Arc Quad: AROM;Left;Supine;Other reps (comment) (3) Heel Slides: AROM;Left;Supine;Other reps (comment) (3) Hip ABduction/ADduction: AROM;Left;Supine;Other reps (comment) (3) Straight Leg Raises: AROM;Left;Supine;Other reps (comment) (3)   Assessment/Plan    PT Assessment Patient needs continued PT services  PT Problem List Decreased strength;Decreased range of motion;Decreased activity tolerance;Decreased balance;Decreased mobility;Decreased knowledge of use of DME;Decreased knowledge of precautions;Pain       PT Treatment Interventions DME instruction;Gait training;Stair training;Functional mobility training;Therapeutic activities;Therapeutic exercise;Balance training;Patient/family education    PT Goals (Current goals can be found in the Care Plan section)  Acute Rehab PT Goals Patient Stated Goal: get back to playing golf PT Goal Formulation: With patient Time For Goal Achievement:  06/08/20 Potential to Achieve Goals: Good    Frequency 7X/week   Barriers to discharge        Co-evaluation               AM-PAC PT "6 Clicks" Mobility  Outcome Measure Help needed turning from your back to your side while in a flat bed without using bedrails?: None Help needed moving from lying on your back to sitting on the side of a flat bed without using bedrails?: None Help needed moving to and from a bed to a chair (including a wheelchair)?: A Little Help needed standing up from a chair using your arms (e.g., wheelchair or bedside chair)?: A Little Help needed to walk in hospital room?: A Little Help needed climbing 3-5 steps with a railing? : A Little 6 Click Score: 20    End of Session Equipment Utilized During Treatment: Gait belt Activity Tolerance: Patient tolerated treatment well Patient left: in bed;with call bell/phone within reach;with family/visitor present Nurse Communication: Mobility status PT Visit Diagnosis: Muscle weakness (generalized) (M62.81);Difficulty in walking, not elsewhere classified (R26.2)    Time: 3335-4562 PT Time Calculation (min) (ACUTE ONLY): 23 min   Charges:   PT Evaluation $PT Eval Low Complexity: 1 Low PT Treatments $Gait Training: 8-22 mins        Verner Mould, DPT Acute Rehabilitation Services Office [phone] Pager [phone]    Jimmy Baldwin 06/01/2020, 6:24 PM

## 2020-06-01 NOTE — Progress Notes (Signed)
Assisted Dr. Chelsey Woodrum with Left Knee Adductor Canal block. Side rails up, monitors on throughout procedure. See vital signs in flow sheet. Tolerated Procedure well.  

## 2020-06-01 NOTE — Anesthesia Procedure Notes (Signed)
Anesthesia Regional Block: Adductor canal block   Pre-Anesthetic Checklist: ,, timeout performed, Correct Patient, Correct Site, Correct Laterality, Correct Procedure, Correct Position, site marked, Risks and benefits discussed,  Surgical consent,  Pre-op evaluation,  At surgeon's request and post-op pain management  Laterality: Left  Prep: Maximum Sterile Barrier Precautions used, chloraprep       Needles:  Injection technique: Single-shot  Needle Type: Echogenic Stimulator Needle     Needle Length: 9cm  Needle Gauge: 22     Additional Needles:   Procedures:,,,, ultrasound used (permanent image in chart),,,,  Narrative:  Start time: 06/01/2020 10:20 AM End time: 06/01/2020 10:24 AM Injection made incrementally with aspirations every 5 mL.  Performed by: Personally  Anesthesiologist: Elmer Picker, MD  Additional Notes: Monitors applied. No increased pain on injection. No increased resistance to injection. Injection made in 5cc increments. Good needle visualization. Patient tolerated procedure well.

## 2020-06-02 ENCOUNTER — Encounter (HOSPITAL_COMMUNITY): Payer: Self-pay | Admitting: Orthopedic Surgery

## 2020-06-14 ENCOUNTER — Other Ambulatory Visit: Payer: Self-pay | Admitting: Cardiovascular Disease

## 2020-08-10 ENCOUNTER — Telehealth: Payer: Self-pay | Admitting: Cardiovascular Disease

## 2020-08-10 NOTE — Telephone Encounter (Signed)
3.24.22 LVM to schedule 1 yr fu w/DrBerry. LP

## 2020-09-28 ENCOUNTER — Ambulatory Visit: Payer: 59 | Admitting: Internal Medicine

## 2020-09-28 ENCOUNTER — Telehealth: Payer: Self-pay | Admitting: Internal Medicine

## 2020-09-28 MED ORDER — ZOLPIDEM TARTRATE 10 MG PO TABS: 10.0000 mg | ORAL_TABLET | Freq: Every evening | ORAL | 1 refills | Status: DC | PRN

## 2020-09-28 NOTE — Telephone Encounter (Signed)
Please advise; PMP done and last filled 06/14/20

## 2020-09-28 NOTE — Telephone Encounter (Signed)
zolpidem (AMBIEN) 10 MG tablet [id number] [place], [place] - [address] Phone:  [phone]  Fax:  [phone]     Last seen- 10.26.22 Next-n/a patient was supposed to have an appointment today but had a covid exposure

## 2020-11-21 ENCOUNTER — Other Ambulatory Visit: Payer: Self-pay | Admitting: Cardiovascular Disease

## 2020-11-23 ENCOUNTER — Telehealth: Payer: Self-pay | Admitting: Cardiovascular Disease

## 2020-11-23 NOTE — Telephone Encounter (Signed)
*  STAT* If patient is at the pharmacy, call can be transferred to refill team.   1. Which medications need to be refilled? (please list name of each medication and dose if known)  flecainide (TAMBOCOR) 50 MG tablet  2. Which pharmacy/location (including street and city if local pharmacy) is medication to be sent to? [id number] - [place] ([place]), [place] - [address]  3. Do they need a 30 day or 90 day supply? 90 day supply  Pt is completely out of this medicine

## 2020-11-29 NOTE — Telephone Encounter (Signed)
90- day Refill for Flecainide was sent to [facility] on [address] on 01/10/22

## 2020-11-30 ENCOUNTER — Encounter: Payer: Self-pay | Admitting: Internal Medicine

## 2020-11-30 ENCOUNTER — Other Ambulatory Visit: Payer: Self-pay

## 2020-11-30 ENCOUNTER — Ambulatory Visit (INDEPENDENT_AMBULATORY_CARE_PROVIDER_SITE_OTHER): Payer: 59 | Admitting: Internal Medicine

## 2020-11-30 VITALS — BP 146/82 | HR 73 | Temp 98.1°F | Ht 69.0 in | Wt 252.2 lb

## 2020-11-30 DIAGNOSIS — I480 Paroxysmal atrial fibrillation: Secondary | ICD-10-CM

## 2020-11-30 DIAGNOSIS — Z0001 Encounter for general adult medical examination with abnormal findings: Secondary | ICD-10-CM | POA: Diagnosis not present

## 2020-11-30 DIAGNOSIS — R739 Hyperglycemia, unspecified: Secondary | ICD-10-CM

## 2020-11-30 DIAGNOSIS — E782 Mixed hyperlipidemia: Secondary | ICD-10-CM | POA: Diagnosis not present

## 2020-11-30 DIAGNOSIS — E538 Deficiency of other specified B group vitamins: Secondary | ICD-10-CM | POA: Diagnosis not present

## 2020-11-30 DIAGNOSIS — I10 Essential (primary) hypertension: Secondary | ICD-10-CM

## 2020-11-30 DIAGNOSIS — E669 Obesity, unspecified: Secondary | ICD-10-CM

## 2020-11-30 DIAGNOSIS — Z1211 Encounter for screening for malignant neoplasm of colon: Secondary | ICD-10-CM | POA: Diagnosis not present

## 2020-11-30 DIAGNOSIS — E559 Vitamin D deficiency, unspecified: Secondary | ICD-10-CM

## 2020-11-30 DIAGNOSIS — F5101 Primary insomnia: Secondary | ICD-10-CM

## 2020-11-30 LAB — BASIC METABOLIC PANEL
BUN: 15 mg/dL (ref 6–23)
CO2: 26 mEq/L (ref 19–32)
Calcium: 9.4 mg/dL (ref 8.4–10.5)
Chloride: 102 mEq/L (ref 96–112)
Creatinine, Ser: 0.91 mg/dL (ref 0.40–1.50)
GFR: 97.64 mL/min (ref >60.00)
Glucose, Bld: 91 mg/dL (ref 70–99)
Potassium: 4.4 mEq/L (ref 3.5–5.1)
Sodium: 136 mEq/L (ref 135–145)

## 2020-11-30 LAB — TSH: TSH: 3.74 u[IU]/mL (ref 0.35–5.50)

## 2020-11-30 LAB — HEPATIC FUNCTION PANEL
ALT: 32 U/L (ref 0–53)
AST: 24 U/L (ref 0–37)
Albumin: 4.8 g/dL (ref 3.5–5.2)
Alkaline Phosphatase: 68 U/L (ref 39–117)
Bilirubin, Direct: 0.1 mg/dL (ref 0.0–0.3)
Total Bilirubin: 0.6 mg/dL (ref 0.2–1.2)
Total Protein: 7.6 g/dL (ref 6.0–8.3)

## 2020-11-30 LAB — LIPID PANEL
Cholesterol: 229 mg/dL — ABNORMAL HIGH (ref 0–200)
HDL: 50.8 mg/dL (ref >39.00)
NonHDL: 178.44
Total CHOL/HDL Ratio: 5
Triglycerides: 225 mg/dL — ABNORMAL HIGH (ref 0.0–149.0)
VLDL: 45 mg/dL — ABNORMAL HIGH (ref 0.0–40.0)

## 2020-11-30 LAB — LDL CHOLESTEROL, DIRECT: Direct LDL: 149 mg/dL

## 2020-11-30 LAB — HEMOGLOBIN A1C: Hgb A1c MFr Bld: 6.3 % (ref 4.6–6.5)

## 2020-11-30 LAB — VITAMIN D 25 HYDROXY (VIT D DEFICIENCY, FRACTURES): VITD: 25.48 ng/mL — ABNORMAL LOW (ref 30.00–100.00)

## 2020-11-30 LAB — PSA: PSA: 0.22 ng/mL (ref 0.10–4.00)

## 2020-11-30 LAB — VITAMIN B12: Vitamin B-12: 219 pg/mL (ref 211–911)

## 2020-11-30 MED ORDER — ZOLPIDEM TARTRATE 10 MG PO TABS: 10.0000 mg | ORAL_TABLET | Freq: Every evening | ORAL | 1 refills | Status: DC | PRN

## 2020-11-30 NOTE — Patient Instructions (Signed)
Please continue all other medications as before, and refills have been done if requested.  Please have the pharmacy call with any other refills you may need.  Please continue your efforts at being more active, low cholesterol diet, and weight control.  You are otherwise up to date with prevention measures today.  Please keep your appointments with your specialists as you may have planned - Cardiology as you mentioned making an appt  You will be contacted regarding the referral for: colonoscopy  Please go to the LAB at the blood drawing area for the tests to be done  You will be contacted by phone if any changes need to be made immediately.  Otherwise, you will receive a letter about your results with an explanation, but please check with MyChart first.  Please remember to sign up for MyChart if you have not done so, as this will be important to you in the future with finding out test results, communicating by private email, and scheduling acute appointments online when needed.  Please make an Appointment to return for your 1 year visit, or sooner if needed .

## 2020-11-30 NOTE — Progress Notes (Signed)
Patient ID: Jimmy Baldwin, male   DOB: 09-22-1970, 50 y.o.   MRN: [id number]         Chief Complaint:: wellness exam and htn, hyperglycemia, obesity, insomnia, PAF       HPI:  Jimmy Baldwin is a 50 y.o. male here for wellness exam; due for colonoscopy, declines shingrix, pneumovax, o/w up to date with preventive referrals and immunizations.                        Also Pt denies chest pain, increased sob or doe, wheezing, orthopnea, PND, increased LE swelling, dizziness or syncope, but does occasional palpitations possibly PAF related and plans to f/u with cardiology.   Pt denies polydipsia, polyuria, or new  focal neuro s/s.   Also with worsening insmonia recently, hard to get to sleep most nights, Denies worsening depressive symptoms, suicidal ideation, or panic; Has gained wt s/p left partial knee replacement after initial arthroscopy.  Plans to do better with wt loss in the near future.   Pt denies fever, wt loss, night sweats, loss of appetite, or other constitutional symptoms  No other new complaints Wt Readings from Last 3 Encounters:  11/30/20 252 lb 3.2 oz (114.4 kg)  06/01/20 233 lb (105.7 kg)  05/30/20 233 lb (105.7 kg)   BP Readings from Last 3 Encounters:  11/30/20 (!) 146/82  06/01/20 123/88  05/30/20 122/80   Immunization History  Administered Date(s) Administered   PPD Test 01/12/2013   Tdap 01/12/2013   Health Maintenance Due  Topic Date Due   URINE MICROALBUMIN  Never done   COLONOSCOPY (Pts 45-80yrs Insurance coverage will need to be confirmed)  Never done      Past Medical History:  Diagnosis Date   Cervical disc disease    per MRI july 2011   DJD (degenerative joint disease)    Dysrhythmia    a-fib   Family history of adverse reaction to anesthesia    motheer , N&V   GERD (gastroesophageal reflux disease)    occ tums   History of stress test 03-07-2011   EF 56% Normal pattern of perfusion in all regions, low risk scan compared to previous study    Hyperlipidemia 06/20/2011   Insomnia 01/11/2016   PAF (paroxysmal atrial fibrillation) (HCC) 2014   PONV (postoperative nausea and vomiting)    Past Surgical History:  Procedure Laterality Date   CARDIAC CATHETERIZATION  06-01-2004   normal cath; chest pain considered to be noncardiac   CARPAL TUNNEL RELEASE  01/17/2012   Procedure: CARPAL TUNNEL RELEASE;  Surgeon: Nicki Reaper, MD;  Location: [facility];  Service: Orthopedics;  Laterality: Left;  left carpal tunnel release   CARPAL TUNNEL RELEASE  02/21/2012   Procedure: CARPAL TUNNEL RELEASE;  Surgeon: Nicki Reaper, MD;  Location: [facility];  Service: Orthopedics;  Laterality: Right;   KNEE ARTHROSCOPY     lt    LEFT HEART CATHETERIZATION WITH CORONARY ANGIOGRAM N/A 03/19/2013   Procedure: LEFT HEART CATHETERIZATION WITH CORONARY ANGIOGRAM;  Surgeon: Marykay Lex, MD;  Location: [facility] CATH LAB;  Service: Cardiovascular;  Laterality: N/A;   left shoulder surgury     x 3   PARTIAL KNEE ARTHROPLASTY Left 06/01/2020   Procedure: UNICOMPARTMENTAL KNEE LEFT MEDIALLY;  Surgeon: Durene Romans, MD;  Location: [facility];  Service: Orthopedics;  Laterality: Left;  90 MINS   right knee surgury      x 4  -  twice with ACL tear   right shoudler surgury     x 1    reports that he quit smoking about 17 years ago. He has never used smokeless tobacco. He reports that he does not drink alcohol and does not use drugs. family history includes Diabetes in an other family member; Healthy in his sister and sister; Heart attack (age of onset: 5355) in his father; Heart disease in his mother and another family member; Hypertension in an other family member. Allergies  Allergen Reactions   Crestor [Rosuvastatin Calcium]     Joint/muscle aching   Lipitor [Atorvastatin]     Joint/muscle aching   Current Outpatient Medications on File Prior to Visit  Medication Sig Dispense Refill   celecoxib (CELEBREX) 200 MG capsule Take 1 capsule (200  mg total) by mouth 2 (two) times daily. 60 capsule 0   docusate sodium (COLACE) 100 MG capsule Take 1 capsule (100 mg total) by mouth 2 (two) times daily. 28 capsule 0   fenofibrate 54 MG tablet TAKE 1 TABLET (54 MG TOTAL) BY MOUTH DAILY. NEED OV FOR FUTURE REFILL. 90 tablet 3   flecainide (TAMBOCOR) 50 MG tablet Take 1 tablet by mouth twice daily 180 tablet 3   HYDROcodone-acetaminophen (NORCO) 7.5-325 MG tablet Take 1-2 tablets by mouth every 4 (four) hours as needed for moderate pain. 60 tablet 0   methocarbamol (ROBAXIN) 500 MG tablet Take 1 tablet (500 mg total) by mouth every 6 (six) hours as needed for muscle spasms. 40 tablet 0   metoprolol tartrate (LOPRESSOR) 25 MG tablet TAKE 1 TABLET 2 (TWO) TIMES DAILY. PLEASE MAKE ANNUAL APPT FOR REFILLS. [phone]. 1ST ATTEMPT. 180 tablet 3   ondansetron (ZOFRAN) 4 MG tablet Take 1 tablet (4 mg total) by mouth every 8 (eight) hours as needed for nausea or vomiting. 20 tablet 0   polyethylene glycol (MIRALAX / GLYCOLAX) 17 g packet Take 17 g by mouth 2 (two) times daily. 28 packet 0   ferrous sulfate (FERROUSUL) 325 (65 FE) MG tablet Take 1 tablet (325 mg total) by mouth 3 (three) times daily with meals for 14 days. 42 tablet 0   No current facility-administered medications on file prior to visit.        ROS:  All others reviewed and negative.  Objective        PE:  BP (!) 146/82 (BP Location: Left Arm, Patient Position: Sitting, Cuff Size: Large)   Pulse 73   Temp 98.1 F (36.7 C) (Oral)   Ht 5\' 9"  (1.753 m)   Wt 252 lb 3.2 oz (114.4 kg)   SpO2 96%   BMI 37.24 kg/m                 Constitutional: Pt appears in NAD               HENT: Head: NCAT.                Right Ear: External ear normal.                 Left Ear: External ear normal.                Eyes: . Pupils are equal, round, and reactive to light. Conjunctivae and EOM are normal               Nose: without d/c or deformity               Neck: Neck supple.  Gross normal  ROM               Cardiovascular: Normal rate and regular rhythm.                 Pulmonary/Chest: Effort normal and breath sounds without rales or wheezing.                Abd:  Soft, NT, ND, + BS, no organomegaly               Neurological: Pt is alert. At baseline orientation, motor grossly intact               Skin: Skin is warm. No rashes, no other new lesions, LE edema - none               Psychiatric: Pt behavior is normal without agitation   Micro: none  Cardiac tracings I have personally interpreted today:  none  Pertinent Radiological findings (summarize): none   Lab Results  Component Value Date   WBC 7.1 11/30/2020   HGB 14.3 11/30/2020   HCT 41.0 11/30/2020   PLT 207.0 11/30/2020   GLUCOSE 91 11/30/2020   CHOL 229 (H) 11/30/2020   TRIG 225.0 (H) 11/30/2020   HDL 50.80 11/30/2020   LDLDIRECT 149.0 11/30/2020   LDLCALC 93 03/14/2020   ALT 32 11/30/2020   AST 24 11/30/2020   NA 136 11/30/2020   K 4.4 11/30/2020   CL 102 11/30/2020   CREATININE 0.91 11/30/2020   BUN 15 11/30/2020   CO2 26 11/30/2020   TSH 3.74 11/30/2020   PSA 0.22 11/30/2020   INR 1.03 03/19/2013   HGBA1C 6.3 11/30/2020   Assessment/Plan:  KARIS EMIG is a 50 y.o. White or Caucasian [1] male with  has a past medical history of Cervical disc disease, DJD (degenerative joint disease), Dysrhythmia, Family history of adverse reaction to anesthesia, GERD (gastroesophageal reflux disease), History of stress test (03-07-2011), Hyperlipidemia (06/20/2011), Insomnia (01/11/2016), PAF (paroxysmal atrial fibrillation) (HCC) (2014), and PONV (postoperative nausea and vomiting).  Encounter for well adult exam with abnormal findings Age and sex appropriate education and counseling updated with regular exercise and diet Referrals for preventative services - for colonoscopy Immunizations addressed - declines shingirx and pneumovax Smoking counseling  - none needed Evidence for depression or other mood  disorder - none significant Most recent labs reviewed. I have personally reviewed and have noted: 1) the patient's medical and social history 2) The patient's current medications and supplements 3) The patient's height, weight, and BMI have been recorded in the chart   PAF, symptomatic- Flecainide initiated 03/19/13 With intermittent palpitations, plans to f/u with cardiology soon  HTN (hypertension) BP Readings from Last 3 Encounters:  11/30/20 (!) 146/82  06/01/20 123/88  05/30/20 122/80   Mild uncontrolled today, pt to continue medical treatment and declines increased BB   Hyperglycemia Lab Results  Component Value Date   HGBA1C 6.3 11/30/2020   Stable, pt to continue current medical treatment  - diet and wt control   Hyperlipidemia Lab Results  Component Value Date   LDLCALC 93 03/14/2020   Stable, pt to continue current - fenofibrate   Obesity Counseled pt on increased activity, lower calories  B12 deficiency Lab Results  Component Value Date   VITAMINB12 219 11/30/2020   Low, to start oral replacement - b12 1000 mcg qd   Vitamin D deficiency Last vitamin D Lab Results  Component Value Date   VD25OH 25.48 (L) 11/30/2020  Low, to start oral replacement   Insomnia Ok for restart ambien qhs prn  Followup: No follow-ups on file.  Oliver Barre, MD 12/03/2020 9:39 PM [facility] - [facility] Internal Medicine

## 2020-12-01 LAB — CBC WITH DIFFERENTIAL/PLATELET
Basophils Absolute: 0 10*3/uL (ref 0.0–0.1)
Basophils Relative: 0.6 % (ref 0.0–3.0)
Eosinophils Absolute: 0.3 10*3/uL (ref 0.0–0.7)
Eosinophils Relative: 3.7 % (ref 0.0–5.0)
HCT: 41 % (ref 39.0–52.0)
Hemoglobin: 14.3 g/dL (ref 13.0–17.0)
Lymphocytes Relative: 25.2 % (ref 12.0–46.0)
Lymphs Abs: 1.8 10*3/uL (ref 0.7–4.0)
MCHC: 34.8 g/dL (ref 30.0–36.0)
MCV: 94.6 fl (ref 78.0–100.0)
Monocytes Absolute: 0.6 10*3/uL (ref 0.1–1.0)
Monocytes Relative: 9 % (ref 3.0–12.0)
Neutro Abs: 4.4 10*3/uL (ref 1.4–7.7)
Neutrophils Relative %: 61.5 % (ref 43.0–77.0)
Platelets: 207 10*3/uL (ref 150.0–400.0)
RBC: 4.34 Mil/uL (ref 4.22–5.81)
RDW: 12.9 % (ref 11.5–15.5)
WBC: 7.1 10*3/uL (ref 4.0–10.5)

## 2020-12-02 ENCOUNTER — Encounter: Payer: Self-pay | Admitting: Internal Medicine

## 2020-12-02 DIAGNOSIS — E559 Vitamin D deficiency, unspecified: Secondary | ICD-10-CM | POA: Insufficient documentation

## 2020-12-02 DIAGNOSIS — E538 Deficiency of other specified B group vitamins: Secondary | ICD-10-CM | POA: Insufficient documentation

## 2020-12-03 NOTE — Assessment & Plan Note (Signed)
Counseled pt on increased activity, lower calories

## 2020-12-03 NOTE — Assessment & Plan Note (Signed)
Age and sex appropriate education and counseling updated with regular exercise and diet Referrals for preventative services - for colonoscopy Immunizations addressed - declines shingirx and pneumovax Smoking counseling  - none needed Evidence for depression or other mood disorder - none significant Most recent labs reviewed. I have personally reviewed and have noted: 1) the patient's medical and social history 2) The patient's current medications and supplements 3) The patient's height, weight, and BMI have been recorded in the chart

## 2020-12-03 NOTE — Assessment & Plan Note (Signed)
Lab Results  Component Value Date   VITAMINB12 219 11/30/2020   Low, to start oral replacement - b12 1000 mcg qd

## 2020-12-03 NOTE — Assessment & Plan Note (Signed)
Last vitamin D Lab Results  Component Value Date   VD25OH 25.48 (L) 11/30/2020   Low, to start oral replacement

## 2020-12-03 NOTE — Assessment & Plan Note (Signed)
Lab Results  Component Value Date   HGBA1C 6.3 11/30/2020   Stable, pt to continue current medical treatment  - diet and wt control

## 2020-12-03 NOTE — Assessment & Plan Note (Signed)
With intermittent palpitations, plans to f/u with cardiology soon

## 2020-12-03 NOTE — Assessment & Plan Note (Signed)
Ok for restart ambien qhs prn 

## 2020-12-03 NOTE — Assessment & Plan Note (Signed)
Lab Results  Component Value Date   LDLCALC 93 03/14/2020   Stable, pt to continue current - fenofibrate

## 2020-12-03 NOTE — Assessment & Plan Note (Signed)
BP Readings from Last 3 Encounters:  11/30/20 (!) 146/82  06/01/20 123/88  05/30/20 122/80   Mild uncontrolled today, pt to continue medical treatment and declines increased BB

## 2021-03-05 ENCOUNTER — Other Ambulatory Visit: Payer: Self-pay | Admitting: Cardiovascular Disease

## 2021-04-03 ENCOUNTER — Ambulatory Visit (INDEPENDENT_AMBULATORY_CARE_PROVIDER_SITE_OTHER): Payer: 59 | Admitting: Internal Medicine

## 2021-04-03 ENCOUNTER — Other Ambulatory Visit: Payer: Self-pay

## 2021-04-03 ENCOUNTER — Encounter: Payer: Self-pay | Admitting: Internal Medicine

## 2021-04-03 VITALS — BP 122/80 | HR 74 | Temp 99.0°F | Ht 69.0 in | Wt 254.0 lb

## 2021-04-03 DIAGNOSIS — M545 Low back pain, unspecified: Secondary | ICD-10-CM | POA: Diagnosis not present

## 2021-04-03 DIAGNOSIS — G8929 Other chronic pain: Secondary | ICD-10-CM

## 2021-04-03 DIAGNOSIS — M25531 Pain in right wrist: Secondary | ICD-10-CM

## 2021-04-03 DIAGNOSIS — F5101 Primary insomnia: Secondary | ICD-10-CM

## 2021-04-03 DIAGNOSIS — M25561 Pain in right knee: Secondary | ICD-10-CM | POA: Diagnosis not present

## 2021-04-03 DIAGNOSIS — Z1211 Encounter for screening for malignant neoplasm of colon: Secondary | ICD-10-CM

## 2021-04-03 DIAGNOSIS — M25562 Pain in left knee: Secondary | ICD-10-CM

## 2021-04-03 MED ORDER — ZOLPIDEM TARTRATE ER 12.5 MG PO TBCR: 12.5000 mg | EXTENDED_RELEASE_TABLET | Freq: Every evening | ORAL | 1 refills | Status: DC | PRN

## 2021-04-03 MED ORDER — MELOXICAM 15 MG PO TABS: 15.0000 mg | ORAL_TABLET | Freq: Every day | ORAL | 1 refills | Status: AC

## 2021-04-03 MED ORDER — METHOCARBAMOL 500 MG PO TABS: 500.0000 mg | ORAL_TABLET | Freq: Four times a day (QID) | ORAL | 2 refills | Status: AC | PRN

## 2021-04-03 NOTE — Patient Instructions (Signed)
Please take all new medication as prescribed - the mobic for anti-inflammatory  You can also take the OTC Voltaren gel as needed, and see Sports Medicine if you dont get better  Ok to try changing the [place] to [place] cr   Please continue all other medications as before, and refills have been done if requested - the muscle relaxer as needed  Please have the pharmacy call with any other refills you may need.  Please continue your efforts at being more active, low cholesterol diet, and weight control.  Please keep your appointments with your specialists as you may have planned  Please make an Appointment to return in 6 months, or sooner if needed

## 2021-04-03 NOTE — Progress Notes (Signed)
Patient ID: Jimmy Baldwin, male   DOB: 1971/02/23, 50 y.o.   MRN: [id number]        Chief Complaint: follow up right medial wrist pain/swelling, bilateral knee pain, worsening sleep difficulty, chronic lbp       HPI:  Jimmy Baldwin is a 50 y.o. male here with acute onset 1 wk right medial wrist pain and swelling for unclear  reason, no hx of trauma or overuse he is aware.  Also has bilateral knee pain with arthritic, mild to mod, intermittent, worse to walk, better to sit.  Pt continues to have recurring LBP without change in severity, bowel or bladder change, fever, wt loss,  worsening LE pain/numbness/weakness, gait change or falls but overall mild worse recently,  also has worsening difficutly sleeping such that Remus Loffler helps get to sleep but cant stay asleep all night, tends to wake about 3 am.  Pt denies chest pain, increased sob or doe, wheezing, orthopnea, PND, increased LE swelling, palpitations, dizziness or syncope.   Pt denies polydipsia, polyuria, or new focal neuro s/s.   Due for colon screening Wt Readings from Last 3 Encounters:  04/03/21 254 lb (115.2 kg)  11/30/20 252 lb 3.2 oz (114.4 kg)  06/01/20 233 lb (105.7 kg)   BP Readings from Last 3 Encounters:  04/03/21 122/80  11/30/20 (!) 146/82  06/01/20 123/88         Past Medical History:  Diagnosis Date   Cervical disc disease    per MRI july 2011   DJD (degenerative joint disease)    Dysrhythmia    a-fib   Family history of adverse reaction to anesthesia    motheer , N&V   GERD (gastroesophageal reflux disease)    occ tums   History of stress test 03-07-2011   EF 56% Normal pattern of perfusion in all regions, low risk scan compared to previous study   Hyperlipidemia 06/20/2011   Insomnia 01/11/2016   PAF (paroxysmal atrial fibrillation) (HCC) 2014   PONV (postoperative nausea and vomiting)    Past Surgical History:  Procedure Laterality Date   CARDIAC CATHETERIZATION  06-01-2004   normal cath; chest pain  considered to be noncardiac   CARPAL TUNNEL RELEASE  01/17/2012   Procedure: CARPAL TUNNEL RELEASE;  Surgeon: Nicki Reaper, MD;  Location: [facility];  Service: Orthopedics;  Laterality: Left;  left carpal tunnel release   CARPAL TUNNEL RELEASE  02/21/2012   Procedure: CARPAL TUNNEL RELEASE;  Surgeon: Nicki Reaper, MD;  Location: [facility];  Service: Orthopedics;  Laterality: Right;   KNEE ARTHROSCOPY     lt    LEFT HEART CATHETERIZATION WITH CORONARY ANGIOGRAM N/A 03/19/2013   Procedure: LEFT HEART CATHETERIZATION WITH CORONARY ANGIOGRAM;  Surgeon: Marykay Lex, MD;  Location: [facility] C[facility];  Service: Cardiovascular;  Laterality: N/A;   left shoulder surgury     x 3   PARTIAL KNEE ARTHROPLASTY Left 06/01/2020   Procedure: UNICOMPARTMENTAL KNEE LEFT MEDIALLY;  Surgeon: Durene Romans, MD;  Location: [facility];  Service: Orthopedics;  Laterality: Left;  90 MINS   right knee surgury      x 4  - twice with ACL tear   right shoudler surgury     x 1    reports that he quit smoking about 18 years ago. His smoking use included cigarettes. He has never used smokeless tobacco. He reports that he does not drink alcohol and does not use drugs. family history includes Diabetes  in an other family member; Healthy in his sister and sister; Heart attack (age of onset: 51) in his father; Heart disease in his mother and another family member; Hypertension in an other family member. Allergies  Allergen Reactions   Crestor [Rosuvastatin Calcium]     Joint/muscle aching   Lipitor [Atorvastatin]     Joint/muscle aching   Current Outpatient Medications on File Prior to Visit  Medication Sig Dispense Refill   fenofibrate 54 MG tablet TAKE 1 TABLET (54 MG TOTAL) BY MOUTH DAILY. NEED OV FOR FUTURE REFILL. 90 tablet 0   flecainide (TAMBOCOR) 50 MG tablet Take 1 tablet by mouth twice daily 180 tablet 3   metoprolol tartrate (LOPRESSOR) 25 MG tablet TAKE 1 TABLET 2 (TWO) TIMES DAILY.  PLEASE MAKE ANNUAL APPT FOR REFILLS. [phone]. 1ST ATTEMPT. 180 tablet 3   polyethylene glycol (MIRALAX / GLYCOLAX) 17 g packet Take 17 g by mouth 2 (two) times daily. 28 packet 0   No current facility-administered medications on file prior to visit.        ROS:  All others reviewed and negative.  Objective        PE:  BP 122/80 (BP Location: Right Arm, Patient Position: Sitting, Cuff Size: Large)   Pulse 74   Temp 99 F (37.2 C) (Oral)   Ht 5\' 9"  (1.753 m)   Wt 254 lb (115.2 kg)   SpO2 96%   BMI 37.51 kg/m                 Constitutional: Pt appears in NAD               HENT: Head: NCAT.                Right Ear: External ear normal.                 Left Ear: External ear normal.                Eyes: . Pupils are equal, round, and reactive to light. Conjunctivae and EOM are normal               Nose: without d/c or deformity               Neck: Neck supple. Gross normal ROM               Cardiovascular: Normal rate and regular rhythm.                 Pulmonary/Chest: Effort normal and breath sounds without rales or wheezing.                Abd:  Soft, NT, ND, + BS, no organomegaly               Neurological: Pt is alert. At baseline orientation, motor grossly intact               Skin: Skin is warm. No rashes, no other new lesions, LE edema - none               Right medial wrist with 1+ swelling, tender without skin change or bony abnormal               Psychiatric: Pt behavior is normal without agitation   Micro: none  Cardiac tracings I have personally interpreted today:  none  Pertinent Radiological findings (summarize): none   Lab Results  Component Value Date   WBC 7.1 11/30/2020  HGB 14.3 11/30/2020   HCT 41.0 11/30/2020   PLT 207.0 11/30/2020   GLUCOSE 91 11/30/2020   CHOL 229 (H) 11/30/2020   TRIG 225.0 (H) 11/30/2020   HDL 50.80 11/30/2020   LDLDIRECT 149.0 11/30/2020   LDLCALC 93 03/14/2020   ALT 32 11/30/2020   AST 24 11/30/2020   NA 136  11/30/2020   K 4.4 11/30/2020   CL 102 11/30/2020   CREATININE 0.91 11/30/2020   BUN 15 11/30/2020   CO2 26 11/30/2020   TSH 3.74 11/30/2020   PSA 0.22 11/30/2020   INR 1.03 03/19/2013   HGBA1C 6.3 11/30/2020   Assessment/Plan:  Jimmy Baldwin is a 50 y.o. White or Caucasian [1] male with  has a past medical history of Cervical disc disease, DJD (degenerative joint disease), Dysrhythmia, Family history of adverse reaction to anesthesia, GERD (gastroesophageal reflux disease), History of stress test (03-07-2011), Hyperlipidemia (06/20/2011), Insomnia (01/11/2016), PAF (paroxysmal atrial fibrillation) (HCC) (2014), and PONV (postoperative nausea and vomiting).  Right wrist pain With soft tissue swelling, etiology unclear, for mobic 15 qd prn and volt gel prn,  to f/u any worsening symptoms or concerns, declines xray or sport med referral for now  Bilateral knee pain For volt gel prn,  to f/u any worsening symptoms or concerns  Lower back pain chronic recurrent, for muscle relaxer prn, to f/u any worsening symptoms or concerns  Insomnia Ok for change ambien to [place] CR for hopeful better efficacy later in the night  Followup: Return if symptoms worsen or fail to improve.  Oliver Barre, MD 04/08/2021 7:36 PM [facility] - [facility] Internal Medicine

## 2021-04-08 ENCOUNTER — Encounter: Payer: Self-pay | Admitting: Internal Medicine

## 2021-04-08 DIAGNOSIS — M25561 Pain in right knee: Secondary | ICD-10-CM | POA: Insufficient documentation

## 2021-04-08 DIAGNOSIS — M25562 Pain in left knee: Secondary | ICD-10-CM | POA: Insufficient documentation

## 2021-04-08 DIAGNOSIS — M25531 Pain in right wrist: Secondary | ICD-10-CM | POA: Insufficient documentation

## 2021-04-08 NOTE — Assessment & Plan Note (Signed)
Ok for change ambien to [place] CR for hopeful better efficacy later in the night

## 2021-04-08 NOTE — Assessment & Plan Note (Signed)
For volt gel prn,  to f/u any worsening symptoms or concerns 

## 2021-04-08 NOTE — Assessment & Plan Note (Addendum)
With soft tissue swelling, etiology unclear, for mobic 15 qd prn and volt gel prn,  to f/u any worsening symptoms or concerns, declines xray or sport med referral for now

## 2021-04-08 NOTE — Assessment & Plan Note (Signed)
chronic recurrent, for muscle relaxer prn, to f/u any worsening symptoms or concerns

## 2021-04-20 ENCOUNTER — Encounter: Payer: Self-pay | Admitting: Internal Medicine

## 2021-04-25 ENCOUNTER — Telehealth: Payer: Self-pay | Admitting: Cardiovascular Disease

## 2021-04-25 NOTE — Telephone Encounter (Signed)
Left message to call back  

## 2021-04-25 NOTE — Telephone Encounter (Signed)
Pt c/o Shortness Of Breath: STAT if SOB developed within the last 24 hours or pt is noticeably SOB on the phone  1. Are you currently SOB (can you hear that pt is SOB on the phone)? no  2. How long have you been experiencing SOB? About a week and a half  3. Are you SOB when sitting or when up moving around? Varies  4. Are you currently experiencing any other symptoms? Patient said he feels like his heart is racing at night and it keeps him from sleeping.   Patient would like to come in and be checked out

## 2021-04-26 ENCOUNTER — Other Ambulatory Visit: Payer: Self-pay

## 2021-04-26 ENCOUNTER — Ambulatory Visit (AMBULATORY_SURGERY_CENTER): Payer: 59

## 2021-04-26 VITALS — Ht 69.0 in | Wt 250.0 lb

## 2021-04-26 DIAGNOSIS — Z1211 Encounter for screening for malignant neoplasm of colon: Secondary | ICD-10-CM

## 2021-04-26 MED ORDER — PEG 3350-KCL-NA BICARB-NACL 420 G PO SOLR: 4000.0000 mL | Freq: Once | ORAL | 0 refills | Status: AC

## 2021-04-26 NOTE — Progress Notes (Signed)
Pre visit completed via phone call; Patient verified name, DOB, and address; No egg or soy allergy known to patient  No issues known to pt with past sedation with any surgeries or procedures Patient denies ever being told they had issues or difficulty with intubation  No FH of Malignant Hyperthermia Pt is not on diet pills Pt is not on home 02  Pt is not on blood thinners  Pt denies issues with constipation currently; has had issues in the past and uses Miralax as needed- does not use more than 2x per week No A flutter--- dx with AFIB (not on medication for treatment) NO PA's for preps discussed with pt in PV today  Discussed with pt there will be an out-of-pocket cost for prep and that varies from $0 to 70 +  dollars - pt verbalized understanding  Due to the COVID-19 pandemic we are asking patients to follow certain guidelines in PV and the [facility]   Pt aware of COVID protocols and [facility] guidelines

## 2021-05-01 NOTE — Telephone Encounter (Signed)
Called patient, LVM to call back to discuss how he was feeling.  Left call back number.

## 2021-05-08 NOTE — Telephone Encounter (Signed)
Called pt,. No answer, left message to return call if he would like to talk.

## 2021-05-10 ENCOUNTER — Encounter: Payer: 59 | Admitting: Internal Medicine

## 2021-06-07 ENCOUNTER — Other Ambulatory Visit: Payer: Self-pay | Admitting: Cardiovascular Disease

## 2021-06-13 ENCOUNTER — Telehealth: Payer: Self-pay

## 2021-06-13 MED ORDER — ZOLPIDEM TARTRATE 10 MG PO TABS: 10.0000 mg | ORAL_TABLET | Freq: Every evening | ORAL | 3 refills | Status: AC | PRN

## 2021-06-13 NOTE — Telephone Encounter (Signed)
Pt calling in requesting a refill on: zolpidem (AMBIEN) 10 MG tablet   Pharmacy: [id number] - [place], [address]  This medication is on the D/C list.    Pt states he wanted to go back to the 10 mg as the extended release he didn't like the way it made him feel.  LOV 04/03/21  Pt CB [phone]

## 2021-06-30 ENCOUNTER — Other Ambulatory Visit: Payer: Self-pay | Admitting: Cardiovascular Disease

## 2021-07-25 ENCOUNTER — Other Ambulatory Visit: Payer: Self-pay | Admitting: Cardiovascular Disease

## 2021-07-25 ENCOUNTER — Encounter: Payer: Self-pay | Admitting: Neurology

## 2021-08-06 ENCOUNTER — Other Ambulatory Visit: Payer: Self-pay | Admitting: Cardiovascular Disease

## 2021-08-16 ENCOUNTER — Other Ambulatory Visit: Payer: Self-pay | Admitting: Cardiovascular Disease

## 2021-09-20 ENCOUNTER — Telehealth: Payer: Self-pay | Admitting: Cardiovascular Disease

## 2021-09-20 NOTE — Telephone Encounter (Signed)
?*  STAT* If patient is at the pharmacy, call can be transferred to refill team. ? ? ?1. Which medications need to be refilled? (please list name of each medication and dose if known) fenofibrate 54 MG tablet ?metoprolol tartrate (LOPRESSOR) 25 MG tablet ? ?2. Which pharmacy/location (including street and city if local pharmacy) is medication to be sent to? [id number] - [place], [place] [address] ? ?3. Do they need a 30 day or 90 day supply? 30 ? ?Patient has appt on May 10th.   ?

## 2021-09-26 ENCOUNTER — Encounter: Payer: Self-pay | Admitting: Nurse Practitioner

## 2021-09-26 ENCOUNTER — Ambulatory Visit: Payer: Commercial Managed Care - HMO | Admitting: Nurse Practitioner

## 2021-09-26 VITALS — BP 132/84 | HR 77 | Resp 20 | Ht 69.0 in | Wt 252.8 lb

## 2021-09-26 DIAGNOSIS — I480 Paroxysmal atrial fibrillation: Secondary | ICD-10-CM

## 2021-09-26 DIAGNOSIS — Z79899 Other long term (current) drug therapy: Secondary | ICD-10-CM

## 2021-09-26 DIAGNOSIS — R079 Chest pain, unspecified: Secondary | ICD-10-CM

## 2021-09-26 DIAGNOSIS — R0789 Other chest pain: Secondary | ICD-10-CM

## 2021-09-26 DIAGNOSIS — E782 Mixed hyperlipidemia: Secondary | ICD-10-CM

## 2021-09-26 MED ORDER — FENOFIBRATE 54 MG PO TABS: 54.0000 mg | ORAL_TABLET | Freq: Every day | ORAL | 3 refills | Status: DC

## 2021-09-26 MED ORDER — METOPROLOL TARTRATE 100 MG PO TABS: ORAL_TABLET | ORAL | 0 refills | Status: AC

## 2021-09-26 NOTE — Patient Instructions (Signed)
Medication Instructions:  ?Metoprolol tartrate 100 mg 2 hours prior to scan  ? ?*If you need a refill on your cardiac medications before your next appointment, please call your pharmacy* ? ? ?Lab Work: ?Your physician recommends that you return for lab work 1 week before scan ?BMET ? ?If you have labs (blood work) drawn today and your tests are completely normal, you will receive your results only by: ?MyChart Message (if you have MyChart) OR ?A paper copy in the mail ?If you have any lab test that is abnormal or we need to change your treatment, we will call you to review the results. ? ? ?Testing/Procedures: ? ? ?Your cardiac CT will be scheduled at one of the below locations:  ? ?[facility] ?[address] ?[place], [place] [address] ?[phone] ? ?OR ? ?[facility] ?[address] ?[address] ?[place], [place] [address] ?[phone] ? ?If scheduled at [facility], please arrive at the [facility] and Children's Entrance (Entrance C2) of [facility] 30 minutes prior to test start time. ?You can use the FREE valet parking offered at entrance C (encouraged to control the heart rate for the test)  ?Proceed to the [facility] Radiology Department (first floor) to check-in and test prep. ? ?All radiology patients and guests should use entrance C2 at [facility], accessed from [facility], even though the hospital's physical address listed is [address] ? ? ? ?If scheduled at [facility], please arrive 15 mins early for check-in and test prep. ? ?Please follow these instructions carefully (unless otherwise directed): ? ?Hold all erectile dysfunction medications at least 3 days (72 hrs) prior to test. ? ?On the Night Before the Test: ?Be sure to Drink plenty of water. ?Do not consume any caffeinated/decaffeinated beverages or chocolate 12 hours prior to your test. ?Do not take any antihistamines 12  hours prior to your test. ?If the patient has contrast allergy: ?Patient will need a prescription for Prednisone and very clear instructions (as follows): ?Prednisone 50 mg - take 13 hours prior to test ?Take another Prednisone 50 mg 7 hours prior to test ?Take another Prednisone 50 mg 1 hour prior to test ?Take Benadryl 50 mg 1 hour prior to test ?Patient must complete all four doses of above prophylactic medications. ?Patient will need a ride after test due to Benadryl. ? ?On the Day of the Test: ?Drink plenty of water until 1 hour prior to the test. ?Do not eat any food 4 hours prior to the test. ?You may take your regular medications prior to the test.  ?Take metoprolol (Lopressor) two hours prior to test. ?HOLD Furosemide/Hydrochlorothiazide morning of the test. ?FEMALES- please wear underwire-free bra if available, avoid dresses & tight clothing ? ? ?*For Clinical Staff only. Please instruct patient the following:* ?Heart Rate Medication Recommendations for Cardiac CT  ?Resting HR < 50 bpm  ?No medication  ?Resting HR 50-60 bpm and BP >110/50 mmHG   ?Consider Metoprolol tartrate 25 mg PO 90-120 min prior to scan  ?Resting HR 60-65 bpm and BP >110/50 mmHG  ?Metoprolol tartrate 50 mg PO 90-120 minutes prior to scan   ?Resting HR > 65 bpm and BP >110/50 mmHG  ?Metoprolol tartrate 100 mg PO 90-120 minutes prior to scan  ?Consider Ivabradine 10-15 mg PO or a calcium channel blocker for resting HR >60 bpm and contraindication to metoprolol tartrate  ?Consider Ivabradine 10-15 mg PO in combination with  metoprolol tartrate for HR >80 bpm   ? ?     ?After the Test: ?Drink plenty of water. ?After receiving IV contrast, you may experience a mild flushed feeling. This is normal. ?On occasion, you may experience a mild rash up to 24 hours after the test. This is not dangerous. If this occurs, you can take Benadryl 25 mg and increase your fluid intake. ?If you experience trouble breathing, this can be serious. If it is  severe call 911 IMMEDIATELY. If it is mild, please call our office. ?If you take any of these medications: Glipizide/Metformin, Avandament, Glucavance, please do not take 48 hours after completing test unless otherwise instructed. ? ?We will call to schedule your test 2-4 weeks out understanding that some insurance companies will need an authorization prior to the service being performed.  ? ?For non-scheduling related questions, please contact the cardiac imaging nurse navigator should you have any questions/concerns: ?Rockwell Alexandria, Cardiac Imaging Nurse Navigator ?Larey Brick, Cardiac Imaging Nurse Navigator ?[facility] ?Direct Office Dial: [phone]  ? ?For scheduling needs, including cancellations and rescheduling, please call [place], [phone].  ? ? ?Follow-Up: ?At [facility], you and your health needs are our priority.  As part of our continuing mission to provide you with exceptional heart care, we have created designated Provider Care Teams.  These Care Teams include your primary Cardiologist (physician) and Advanced Practice Providers (APPs -  Physician Assistants and Nurse Practitioners) who all work together to provide you with the care you need, when you need it. ? ?We recommend signing up for the patient portal called "MyChart".  Sign up information is provided on this After Visit Summary.  MyChart is used to connect with patients for Virtual Visits (Telemedicine).  Patients are able to view lab/test results, encounter notes, upcoming appointments, etc.  Non-urgent messages can be sent to your provider as well.   ?To learn more about what you can do with MyChart, go to [email or web address].   ? ?Your next appointment:   ?1 year(s) ? ?The format for your next appointment:   ?In Person ? ?Provider:   ?Nanetta Batty, MD   ? ? ?Other Instructions ? ? ?Important Information About Sugar ? ? ? ? ? ? ?

## 2021-09-26 NOTE — Progress Notes (Signed)
? ? ?Office Visit  ?  ?Patient Name: Jimmy SanesCharles D Baldwin ?Date of Encounter: 09/26/2021 ? ?Primary Care Provider:  Corwin LevinsJohn, James W, MD ?Primary Cardiologist:  Nanetta BattyJonathan Berry, MD ? ?Chief Complaint  ?  ?51 year old male with a history of paroxysmal atrial fibrillation, atypical chest pain, and hyperlipidemia who presents for follow-up related to chest pain and atrial fibrillation.  ? ?Past Medical History  ?  ?Past Medical History:  ?Diagnosis Date  ? Cervical disc disease   ? per MRI july 2011  ? DJD (degenerative joint disease)   ? Dysrhythmia   ? a-fib  ? Family history of adverse reaction to anesthesia   ? motheer , N&V  ? GERD (gastroesophageal reflux disease)   ? uses OTC med PRN  ? History of stress test 03/07/2011  ? EF 56% Normal pattern of perfusion in all regions, low risk scan compared to previous study  ? Hyperlipidemia 06/20/2011  ? on meds  ? Hypertension   ? on meds  ? Insomnia 01/11/2016  ? PAF (paroxysmal atrial fibrillation) (HCC) 2014  ? PONV (postoperative nausea and vomiting)   ? ?Past Surgical History:  ?Procedure Laterality Date  ? CARDIAC CATHETERIZATION  06/01/2004  ? normal cath; chest pain considered to be noncardiac  ? CARPAL TUNNEL RELEASE  01/17/2012  ? Procedure: CARPAL TUNNEL RELEASE;  Surgeon: Nicki ReaperGary R Kuzma, MD;  Location: [facility];  Service: Orthopedics;  Laterality: Left;  left carpal tunnel release  ? CARPAL TUNNEL RELEASE  02/21/2012  ? Procedure: CARPAL TUNNEL RELEASE;  Surgeon: Nicki ReaperGary R Kuzma, MD;  Location: [facility];  Service: Orthopedics;  Laterality: Right;  ? KNEE ARTHROSCOPY Left   ? LEFT HEART CATHETERIZATION WITH CORONARY ANGIOGRAM N/A 03/19/2013  ? Procedure: LEFT HEART CATHETERIZATION WITH CORONARY ANGIOGRAM;  Surgeon: Marykay Lexavid W Harding, MD;  [facility];  Service: Cardiovascular;  Laterality: N/A;  ? left shoulder surgury    ? x 3  ? PARTIAL KNEE ARTHROPLASTY Left 06/01/2020  ? Procedure: UNICOMPARTMENTAL KNEE LEFT MEDIALLY;   Surgeon: Durene Romanslin, Matthew, MD;  Location: [facility];  Service: Orthopedics;  Laterality: Left;  90 MINS  ? right knee surgury    ?  x 4  - twice with ACL tear  ? right shoudler surgury    ? x 1  ? WISDOM TOOTH EXTRACTION  1990  ? ? ?Allergies ? ?Allergies  ?Allergen Reactions  ? Crestor [Rosuvastatin Calcium]   ?  Joint/muscle aching  ? Lipitor [Atorvastatin]   ?  Joint/muscle aching  ? ? ?History of Present Illness  ?  ?51 year old male with the above past medical history including paroxysmal atrial fibrillation, atypical chest pain, and hyperlipidemia. ? ?He has a history of atypical chest pain. Cardiac catheterization in 2006 revealed normal coronary arteries.  He was hospitalized in October 2014 in the setting of new onset atrial fibrillation with RVR. Repeat cardiac catheterization at the time showed normal coronary arteries, normal LV function. He converted to NSR and was placed on flecainide, not on anticoagulation therapy (CHA2DS2VASc = 0).  He contacted our office in December 2022 with complaints of shortness of breath, feeling that his heart was racing lying in bed.  An attempt was made to contact the patient to discuss his symptoms, however, there is no documented return call. He was last seen in the office on 05/26/2020 and was stable from a cardiac standpoint. He was cleared for knee surgery at the time. He was evaluated in the ED on  08/02/2021 with right flank pain. CT of the abdomen/pelvis was unremarkable, UA was negative.  His pain was thought to be musculoskeletal in etiology, he was started on Flexeril, advised to take ibuprofen and follow-up with his PCP. ? ?He presents today for follow-up. Since his last visit he has been stable overall from a cardiac standpoint. He reports a 2-year history of vague intermittent left-sided chest pressure (near the pectoral region) that occurs at rest and sometimes with activity, lasts for seconds to minutes at at time, and resolves spontaneously. He denies dyspnea, other  symptoms concerning for angina, denies palpitations, dizziness, lightheadedness, presyncope, syncope, edema, weight gain, PND, orthopnea. Other than his ongoing intermittent chest pressure, he denies any additional concerns today. ? ?Home Medications  ?  ?Current Outpatient Medications  ?Medication Sig Dispense Refill  ? fenofibrate 54 MG tablet TAKE 1 TABLET (54 MG TOTAL) BY MOUTH DAILY. NEED OV FOR FUTURE REFILL. 90 tablet 0  ? flecainide (TAMBOCOR) 50 MG tablet Take 1 tablet by mouth twice daily 180 tablet 3  ? meloxicam (MOBIC) 15 MG tablet Take 1 tablet (15 mg total) by mouth daily. 90 tablet 1  ? methocarbamol (ROBAXIN) 500 MG tablet Take 1 tablet (500 mg total) by mouth every 6 (six) hours as needed for muscle spasms. 40 tablet 2  ? metoprolol tartrate (LOPRESSOR) 25 MG tablet TAKE 1 TABLET (25 MG) BY MOUTH 2 (TWO) TIMES DAILY. SCHEDULE AN APPOINTMENT FOR FURTHER REFILLS 30 tablet 1  ? polyethylene glycol (MIRALAX / GLYCOLAX) 17 g packet Take 17 g by mouth 2 (two) times daily. (Patient taking differently: Take 17 g by mouth daily as needed.) 28 packet 0  ? zolpidem (AMBIEN) 10 MG tablet Take 1 tablet (10 mg total) by mouth at bedtime as needed for sleep. 90 tablet 3  ? ?No current facility-administered medications for this visit.  ?  ? ?Review of Systems  ?  ?He denies chest pain, palpitations, dyspnea, pnd, orthopnea, n, v, dizziness, syncope, edema, weight gain, or early satiety. All other systems reviewed and are otherwise negative except as noted above.   ? ?Physical Exam  ?  ?VS:  BP 132/84 (BP Location: Left Arm, Patient Position: Sitting, Cuff Size: Normal)   Pulse 77   Resp 20   Ht 5\' 9"  (1.753 m)   Wt 252 lb 12.8 oz (114.7 kg)   SpO2 96%   BMI 37.33 kg/m?   ?GEN: Well nourished, well developed, in no acute distress. ?HEENT: normal. ?Neck: Supple, no JVD, carotid bruits, or masses. ?Cardiac: RRR, no murmurs, rubs, or gallops. No clubbing, cyanosis, edema.  Radials/DP/PT 2+ and equal bilaterally.   ?Respiratory:  Respirations regular and unlabored, clear to auscultation bilaterally. ?GI: Soft, nontender, nondistended, BS + x 4. ?MS: no deformity or atrophy. ?Skin: warm and dry, no rash. ?Neuro:  Strength and sensation are intact. ?Psych: Normal affect. ? ?Accessory Clinical Findings  ?  ?ECG personally reviewed by me today -NSR, 77 bpm- no acute changes. ? ?Lab Results  ?Component Value Date  ? WBC 7.1 11/30/2020  ? HGB 14.3 11/30/2020  ? HCT 41.0 11/30/2020  ? MCV 94.6 11/30/2020  ? PLT 207.0 11/30/2020  ? ?Lab Results  ?Component Value Date  ? CREATININE 0.91 11/30/2020  ? BUN 15 11/30/2020  ? NA 136 11/30/2020  ? K 4.4 11/30/2020  ? CL 102 11/30/2020  ? CO2 26 11/30/2020  ? ?Lab Results  ?Component Value Date  ? ALT 32 11/30/2020  ? AST 24 11/30/2020  ?  ALKPHOS 68 11/30/2020  ? BILITOT 0.6 11/30/2020  ? ?Lab Results  ?Component Value Date  ? CHOL 229 (H) 11/30/2020  ? HDL 50.80 11/30/2020  ? LDLCALC 93 03/14/2020  ? LDLDIRECT 149.0 11/30/2020  ? TRIG 225.0 (H) 11/30/2020  ? CHOLHDL 5 11/30/2020  ?  ?Lab Results  ?Component Value Date  ? HGBA1C 6.3 11/30/2020  ? ? ?Assessment & Plan  ?  ?1. Atypical chest pain: Cath in 2006 showed normal coronary arteries, repeat cath in 2014 was unchanged.  The past 2 years he has noted intermittent vague left-sided chest pressure (near the pectoral region) that occurs at rest and with activity, last for seconds to minutes at a time, resolved spontaneously. He denies any other associated symptoms.  His symptoms are atypical, however, he is concerned that his symptoms are coming from his heart and requests further ischemic evaluation. Through shared decision making, will pursue coronary CTA for further evaluation.  ? ?2. Paroxysmal atrial fibrillation: Maintaining sinus rhythm on flecainide. Not on oral anticoagulation therapy (CHA2DS2VASc = 0). He denies any recent palpitations.  Continue metoprolol, flecainide. ? ?3. Hyperlipidemia: Direct LDL was 149 in 11/2020. Monitored  and managed per PCP. Discussed lifestyle modifications with diet and exercise. He is statin intolerant.  Continue fenofibrate.  ? ?4. Disposition: Follow-up in 1 year. He declines scheduling follow-up appointme

## 2021-09-28 MED ORDER — METOPROLOL TARTRATE 25 MG PO TABS: ORAL_TABLET | ORAL | 1 refills | Status: DC

## 2021-09-28 NOTE — Telephone Encounter (Signed)
Refill sent to pharmacy.   

## 2021-10-25 ENCOUNTER — Encounter (HOSPITAL_COMMUNITY): Payer: Self-pay

## 2021-11-26 ENCOUNTER — Telehealth: Payer: Self-pay | Admitting: Cardiovascular Disease

## 2021-11-26 ENCOUNTER — Other Ambulatory Visit: Payer: Self-pay

## 2021-11-26 MED ORDER — FLECAINIDE ACETATE 50 MG PO TABS: 50.0000 mg | ORAL_TABLET | Freq: Two times a day (BID) | ORAL | 3 refills | Status: DC

## 2021-11-26 NOTE — Telephone Encounter (Signed)
Medication refilled and sent to desired pharmacy ?

## 2021-11-26 NOTE — Telephone Encounter (Signed)
*  STAT* If patient is at the pharmacy, call can be transferred to refill team.   1. Which medications need to be refilled? (please list name of each medication and dose if known) flecainide (TAMBOCOR) 50 MG tablet  2. Which pharmacy/location (including street and city if local pharmacy) is medication to be sent to? [facility] on [address], [place]  3. Do they need a 30 day or 90 day supply? 90 day   Patient is out of medication. He was seen 09/26/2021

## 2021-11-28 ENCOUNTER — Ambulatory Visit (INDEPENDENT_AMBULATORY_CARE_PROVIDER_SITE_OTHER): Payer: Commercial Managed Care - HMO | Admitting: Family Medicine

## 2021-11-28 ENCOUNTER — Encounter: Payer: Self-pay | Admitting: Family Medicine

## 2021-11-28 VITALS — BP 120/74 | HR 81 | Temp 97.2°F | Ht 69.0 in | Wt 251.0 lb

## 2021-11-28 DIAGNOSIS — M25531 Pain in right wrist: Secondary | ICD-10-CM | POA: Diagnosis not present

## 2021-11-28 DIAGNOSIS — M25561 Pain in right knee: Secondary | ICD-10-CM | POA: Diagnosis not present

## 2021-11-28 DIAGNOSIS — M25532 Pain in left wrist: Secondary | ICD-10-CM | POA: Diagnosis not present

## 2021-11-28 DIAGNOSIS — M25542 Pain in joints of left hand: Secondary | ICD-10-CM

## 2021-11-28 DIAGNOSIS — M25562 Pain in left knee: Secondary | ICD-10-CM

## 2021-11-28 DIAGNOSIS — R21 Rash and other nonspecific skin eruption: Secondary | ICD-10-CM

## 2021-11-28 DIAGNOSIS — M25541 Pain in joints of right hand: Secondary | ICD-10-CM | POA: Diagnosis not present

## 2021-11-28 LAB — TSH: TSH: 4.14 u[IU]/mL (ref 0.35–5.50)

## 2021-11-28 LAB — CBC WITH DIFFERENTIAL/PLATELET
Basophils Absolute: 0 10*3/uL (ref 0.0–0.1)
Basophils Relative: 0.5 % (ref 0.0–3.0)
Eosinophils Absolute: 0.2 10*3/uL (ref 0.0–0.7)
Eosinophils Relative: 3.3 % (ref 0.0–5.0)
HCT: 40.4 % (ref 39.0–52.0)
Hemoglobin: 13.5 g/dL (ref 13.0–17.0)
Lymphocytes Relative: 23.3 % (ref 12.0–46.0)
Lymphs Abs: 1.4 10*3/uL (ref 0.7–4.0)
MCHC: 33.4 g/dL (ref 30.0–36.0)
MCV: 94.5 fl (ref 78.0–100.0)
Monocytes Absolute: 0.5 10*3/uL (ref 0.1–1.0)
Monocytes Relative: 8.5 % (ref 3.0–12.0)
Neutro Abs: 3.7 10*3/uL (ref 1.4–7.7)
Neutrophils Relative %: 64.4 % (ref 43.0–77.0)
Platelets: 209 10*3/uL (ref 150.0–400.0)
RBC: 4.28 Mil/uL (ref 4.22–5.81)
RDW: 12.4 % (ref 11.5–15.5)
WBC: 5.8 10*3/uL (ref 4.0–10.5)

## 2021-11-28 LAB — C-REACTIVE PROTEIN: CRP: <1 mg/dL (ref 0.5–20.0)

## 2021-11-28 LAB — SEDIMENTATION RATE: Sed Rate: 21 mm/hr — ABNORMAL HIGH (ref 0–20)

## 2021-11-28 MED ORDER — KETOROLAC TROMETHAMINE 60 MG/2ML IM SOLN
60.0000 mg | Freq: Once | INTRAMUSCULAR | Status: AC
  Administered 2021-11-28: 60 mg via INTRAMUSCULAR

## 2021-11-28 MED ORDER — PREDNISONE 10 MG (21) PO TBPK: ORAL_TABLET | Freq: Every day | ORAL | 0 refills | Status: AC

## 2021-11-28 NOTE — Patient Instructions (Signed)
We gave you a Toradol 60 mg IM injection in the office today.  You may start the oral steroid Dosepak in the morning with breakfast and a full glass of water.  Take the steroids as discussed tapering each morning. Stay well-hydrated.  We will be in touch with your lab results.

## 2021-11-28 NOTE — Assessment & Plan Note (Signed)
Dryness and scaling over MCP joints bilaterally. Recommend good moisturizer. Check labs and follow up

## 2021-11-28 NOTE — Assessment & Plan Note (Signed)
Toradol 60 mg IM given in office. Oral steroid dose pak prescribed. Suspicious for RA or autoimmune conditions. Check labs and follow up.

## 2021-11-28 NOTE — Assessment & Plan Note (Addendum)
Toradol 60 mg IM given in office. He has tried Aleve, ibuprofen, Tylenol, meloxicam and Voltaren gel without relief. Prednisone dose pak prescribed. Advised how to take the medication and potential side effects. Follow up pending labs. declines X rays.  Reviewed notes and results from Dr. Merlyn Lot and Dr. Jonny Ruiz

## 2021-11-28 NOTE — Progress Notes (Signed)
Subjective:     Patient ID: Jimmy Baldwin, male    DOB: Jul 27, 1970, 51 y.o.   MRN: [id number]  Chief Complaint  Patient presents with   Arthritis    Having bad arthritis pain on both hands around the wrist and thumb been consistent over a month now     HPI Patient is in today for a 2 month hx of bilateral thumb, several finger joints and bilateral wrist pain and stiffness.   Morning stiffness for an hour before his joints feels better.   Denies redness, increased warmth or swelling.   Reports increased skin dryness and scaling over hand joints.  No nail issues.  Denies fever, chills, headache, dizziness, chest pain, palpitations, shortness of breath, abdominal pain, nausea, vomiting or diarrhea.  Denies being tested for RA or other autoimmune conditions in the past  He plays golf and pain is usually worse after playing golf.  States he has a 4-day golf tournament this weekend and needs something for pain control.  Reports trying multiple over-the-counter pain medications as well as prescription meloxicam and topical Voltaren gel without any significant relief of pain.  History of back pain, bilateral knee pain with a total knee replacement and bilateral CTS with surgery.  States his father had psoriasis.      Health Maintenance Due  Topic Date Due   Diabetic kidney evaluation - Urine ACR  Never done   Zoster Vaccines- Shingrix (1 of 2) Never done   HEMOGLOBIN A1C  06/02/2021   OPHTHALMOLOGY EXAM  07/03/2021   Diabetic kidney evaluation - GFR measurement  11/30/2021    Past Medical History:  Diagnosis Date   Cervical disc disease    per MRI july 2011   DJD (degenerative joint disease)    Dysrhythmia    a-fib   Family history of adverse reaction to anesthesia    motheer , N&V   GERD (gastroesophageal reflux disease)    uses OTC med PRN   History of stress test 03/07/2011   EF 56% Normal pattern of perfusion in all regions, low risk scan compared to  previous study   Hyperlipidemia 06/20/2011   on meds   Hypertension    on meds   Insomnia 01/11/2016   PAF (paroxysmal atrial fibrillation) (HCC) 2014   PONV (postoperative nausea and vomiting)     Past Surgical History:  Procedure Laterality Date   CARDIAC CATHETERIZATION  06/01/2004   normal cath; chest pain considered to be noncardiac   CARPAL TUNNEL RELEASE  01/17/2012   Procedure: CARPAL TUNNEL RELEASE;  Surgeon: Nicki Reaper, MD;  Location: [facility];  Service: Orthopedics;  Laterality: Left;  left carpal tunnel release   CARPAL TUNNEL RELEASE  02/21/2012   Procedure: CARPAL TUNNEL RELEASE;  Surgeon: Nicki Reaper, MD;  Location: [facility];  Service: Orthopedics;  Laterality: Right;   KNEE ARTHROSCOPY Left    LEFT HEART CATHETERIZATION WITH CORONARY ANGIOGRAM N/A 03/19/2013   Procedure: LEFT HEART CATHETERIZATION WITH CORONARY ANGIOGRAM;  Surgeon: Marykay Lex, MD;  Location: [facility] CATH LAB;  Service: Cardiovascular;  Laterality: N/A;   left shoulder surgury     x 3   PARTIAL KNEE ARTHROPLASTY Left 06/01/2020   Procedure: UNICOMPARTMENTAL KNEE LEFT MEDIALLY;  Surgeon: Durene Romans, MD;  Location: [facility];  Service: Orthopedics;  Laterality: Left;  90 MINS   right knee surgury      x 4  - twice with ACL tear   right shoudler  surgury     x 1   WISDOM TOOTH EXTRACTION  1990    Family History  Problem Relation Age of Onset   Heart disease Mother    Heart attack Father 27       cabg   Healthy Sister    Healthy Sister    Heart disease Other    Hypertension Other    Diabetes Other    Colon cancer Neg Hx    Colon polyps Neg Hx    Esophageal cancer Neg Hx    Rectal cancer Neg Hx    Stomach cancer Neg Hx     Social History   Socioeconomic History   Marital status: Married    Spouse name: Not on file   Number of children: Not on file   Years of education: 16   Highest education level: Not on file  Occupational History   Occupation:  Pastor  Tobacco Use   Smoking status: Former    Types: Cigarettes    Quit date: 01/16/2003    Years since quitting: 18.8   Smokeless tobacco: Never  Vaping Use   Vaping Use: Never used  Substance and Sexual Activity   Alcohol use: No   Drug use: No   Sexual activity: Not on file  Other Topics Concern   Not on file  Social History Narrative   Not on file   Social Determinants of Health   Financial Resource Strain: Not on file  Food Insecurity: Not on file  Transportation Needs: Not on file  Physical Activity: Not on file  Stress: Not on file  Social Connections: Not on file  Intimate Partner Violence: Not on file    Outpatient Medications Prior to Visit  Medication Sig Dispense Refill   fenofibrate 54 MG tablet Take 1 tablet (54 mg total) by mouth daily. NEED OV FOR FUTURE REFILL. 90 tablet 3   flecainide (TAMBOCOR) 50 MG tablet Take 1 tablet (50 mg total) by mouth 2 (two) times daily. 180 tablet 3   metoprolol tartrate (LOPRESSOR) 100 MG tablet One tab 2 hours prior to scan 1 tablet 0   metoprolol tartrate (LOPRESSOR) 25 MG tablet TAKE 1 TABLET (25 MG) BY MOUTH 2 (TWO) TIMES DAILY. 180 tablet 1   zolpidem (AMBIEN) 10 MG tablet Take 1 tablet (10 mg total) by mouth at bedtime as needed for sleep. 90 tablet 3   meloxicam (MOBIC) 15 MG tablet Take 1 tablet (15 mg total) by mouth daily. (Patient not taking: Reported on 11/28/2021) 90 tablet 1   methocarbamol (ROBAXIN) 500 MG tablet Take 1 tablet (500 mg total) by mouth every 6 (six) hours as needed for muscle spasms. (Patient not taking: Reported on 11/28/2021) 40 tablet 2   polyethylene glycol (MIRALAX / GLYCOLAX) 17 g packet Take 17 g by mouth 2 (two) times daily. (Patient not taking: Reported on 11/28/2021) 28 packet 0   No facility-administered medications prior to visit.    Allergies  Allergen Reactions   Crestor [Rosuvastatin Calcium]     Joint/muscle aching   Lipitor [Atorvastatin]     Joint/muscle aching    ROS      Objective:    Physical Exam Constitutional:      General: He is not in acute distress.    Appearance: He is not ill-appearing.  Eyes:     Extraocular Movements: Extraocular movements intact.     Conjunctiva/sclera: Conjunctivae normal.     Pupils: Pupils are equal, round, and reactive to light.  Cardiovascular:  Rate and Rhythm: Normal rate and regular rhythm.  Pulmonary:     Effort: Pulmonary effort is normal.     Breath sounds: Normal breath sounds.  Musculoskeletal:     Right elbow: Normal.     Left elbow: Normal.     Right forearm: Normal.     Left forearm: Normal.     Right wrist: Tenderness present. No swelling, deformity or crepitus. Normal range of motion. Normal pulse.     Left wrist: Tenderness present. No swelling, deformity or crepitus. Normal range of motion. Normal pulse.     Right hand: Tenderness present. No swelling or deformity. Normal range of motion. Normal strength. Normal sensation. Normal capillary refill. Normal pulse.     Left hand: Tenderness present. No swelling or deformity. Normal range of motion. Normal strength. Normal sensation. Normal capillary refill. Normal pulse.     Cervical back: Normal range of motion and neck supple.     Comments: Tenderness to bilateral wrists ulnar and radial. Bilateral hand and finger joints with TTP. Bilateral upper extremities are neurovascularly intact.   Skin:    General: Skin is warm and dry.     Comments: Dryness and scaling noted over MCP joints of both hands.  No pitting of nails  Neurological:     General: No focal deficit present.     Mental Status: He is alert and oriented to person, place, and time.     Cranial Nerves: No cranial nerve deficit.     Sensory: No sensory deficit.     Motor: No weakness.     Coordination: Coordination normal.     Gait: Gait normal.  Psychiatric:        Mood and Affect: Mood normal.        Behavior: Behavior normal.        Thought Content: Thought content normal.      BP 120/74 (BP Location: Left Arm, Patient Position: Sitting, Cuff Size: Large)   Pulse 81   Temp (!) 97.2 F (36.2 C) (Temporal)   Ht 5\' 9"  (1.753 m)   Wt 251 lb (113.9 kg)   SpO2 95%   BMI 37.07 kg/m  Wt Readings from Last 3 Encounters:  11/28/21 251 lb (113.9 kg)  09/26/21 252 lb 12.8 oz (114.7 kg)  04/26/21 250 lb (113.4 kg)       Assessment & Plan:   Problem List Items Addressed This Visit       Musculoskeletal and Integument   Rash of both hands    Dryness and scaling over MCP joints bilaterally. Recommend good moisturizer. Check labs and follow up      Relevant Orders   CBC with Differential/Platelet   Sedimentation rate   Rheumatoid factor   C-reactive protein   ANA   Cyclic citrul peptide antibody, IgG   TSH     Other   Bilateral knee pain   Relevant Orders   CBC with Differential/Platelet   Sedimentation rate   Rheumatoid factor   C-reactive protein   ANA   Cyclic citrul peptide antibody, IgG   TSH   Pain in both wrists - Primary    Toradol 60 mg IM given in office. He has tried Aleve, ibuprofen, Tylenol, meloxicam and Voltaren gel without relief. Prednisone dose pak prescribed. Advised how to take the medication and potential side effects. Follow up pending labs. declines X rays.  Reviewed notes and results from Dr. Merlyn LotKuzma and Dr. Jonny RuizJohn      Relevant Medications  predniSONE (STERAPRED UNI-PAK 21 TAB) 10 MG (21) TBPK tablet   Other Relevant Orders   CBC with Differential/Platelet   Sedimentation rate   Rheumatoid factor   C-reactive protein   ANA   Cyclic citrul peptide antibody, IgG   TSH   Pain involving joints of fingers of both hands    Toradol 60 mg IM given in office. Oral steroid dose pak prescribed. Suspicious for RA or autoimmune conditions. Check labs and follow up.       Relevant Medications   predniSONE (STERAPRED UNI-PAK 21 TAB) 10 MG (21) TBPK tablet   Other Relevant Orders   CBC with Differential/Platelet    Sedimentation rate   Rheumatoid factor   C-reactive protein   ANA   Cyclic citrul peptide antibody, IgG   TSH    I am having Mithcell D. Romberg start on predniSONE. I am also having him maintain his polyethylene glycol, methocarbamol, meloxicam, zolpidem, fenofibrate, metoprolol tartrate, metoprolol tartrate, and flecainide. We administered ketorolac.  Meds ordered this encounter  Medications   predniSONE (STERAPRED UNI-PAK 21 TAB) 10 MG (21) TBPK tablet    Sig: Take by mouth daily.    Dispense:  21 tablet    Refill:  0    Order Specific Question:   Supervising Provider    Answer:   Pricilla Holm A [[id number]]   ketorolac (TORADOL) injection 60 mg

## 2021-11-30 LAB — ANA: Anti Nuclear Antibody (ANA): NEGATIVE

## 2021-11-30 LAB — RHEUMATOID FACTOR: Rheumatoid fact SerPl-aCnc: <14 IU/mL (ref <14)

## 2021-11-30 LAB — CYCLIC CITRUL PEPTIDE ANTIBODY, IGG: Cyclic Citrullin Peptide Ab: <16 UNITS

## 2021-11-30 NOTE — Progress Notes (Signed)
His labs are negative for rheumatoid arthritis, lupus, or other autoimmune and inflammatory disorders.

## 2022-01-07 ENCOUNTER — Ambulatory Visit: Payer: Self-pay | Admitting: Neurology

## 2022-04-08 ENCOUNTER — Other Ambulatory Visit: Payer: Self-pay | Admitting: Cardiovascular Disease

## 2022-04-23 ENCOUNTER — Telehealth: Payer: Self-pay | Admitting: Cardiovascular Disease

## 2022-04-23 NOTE — Telephone Encounter (Signed)
*  STAT* If patient is at the pharmacy, call can be transferred to refill team.   1. Which medications need to be refilled? (please list name of each medication and dose if known)   fenofibrate 54 MG tablet   2. Which pharmacy/location (including street and city if local pharmacy) is medication to be sent to?  [id number] - [place], [place] - [address]   3. Do they need a 30 day or 90 day supply? 90 day  Patient stated he is completely out of this medication.

## 2022-04-24 MED ORDER — FENOFIBRATE 54 MG PO TABS: 54.0000 mg | ORAL_TABLET | Freq: Every day | ORAL | 3 refills | Status: DC

## 2022-09-25 ENCOUNTER — Other Ambulatory Visit: Payer: Self-pay

## 2022-09-25 ENCOUNTER — Other Ambulatory Visit: Payer: Self-pay | Admitting: Cardiovascular Disease

## 2022-11-23 ENCOUNTER — Other Ambulatory Visit: Payer: Self-pay | Admitting: Cardiovascular Disease

## 2023-01-06 ENCOUNTER — Other Ambulatory Visit: Payer: Self-pay | Admitting: Cardiovascular Disease

## 2023-01-06 ENCOUNTER — Telehealth: Payer: Self-pay | Admitting: Cardiovascular Disease

## 2023-01-06 MED ORDER — METOPROLOL TARTRATE 25 MG PO TABS: ORAL_TABLET | ORAL | 0 refills | Status: DC

## 2023-01-06 NOTE — Telephone Encounter (Signed)
Pt's medication was sent to pt's pharmacy as requested. Confirmation received.  °

## 2023-01-06 NOTE — Telephone Encounter (Signed)
 *  STAT* If patient is at the pharmacy, call can be transferred to refill team.   1. Which medications need to be refilled? (please list name of each medication and dose if known) metoprolol tartrate (LOPRESSOR) 25 MG tablet   2. Would you like to learn more about the convenience, safety, & potential cost savings by using the [facility]? No      3. Are you open to using the [facility] (Type [facility]. No    4. Which pharmacy/location (including street and city if local pharmacy) is medication to be sent to? [id number] - [place], [place] - [address]     5. Do they need a 30 day or 90 day supply? 90 days  Pt is going out of town tomorrow and need refill today. He made an appt with Dr. Allyson Sabal on 04/08/23 at 8;15 am

## 2023-02-22 ENCOUNTER — Other Ambulatory Visit: Payer: Self-pay | Admitting: Cardiovascular Disease

## 2023-04-02 ENCOUNTER — Other Ambulatory Visit: Payer: Self-pay | Admitting: Cardiovascular Disease

## 2023-04-08 ENCOUNTER — Encounter: Payer: Self-pay | Admitting: Cardiovascular Disease

## 2023-04-08 ENCOUNTER — Ambulatory Visit: Payer: 59 | Attending: Cardiovascular Disease | Admitting: Cardiovascular Disease

## 2023-04-08 VITALS — BP 120/62 | HR 67 | Ht 69.0 in | Wt 247.0 lb

## 2023-04-08 DIAGNOSIS — I480 Paroxysmal atrial fibrillation: Secondary | ICD-10-CM | POA: Diagnosis not present

## 2023-04-08 DIAGNOSIS — E782 Mixed hyperlipidemia: Secondary | ICD-10-CM | POA: Diagnosis not present

## 2023-04-08 NOTE — Assessment & Plan Note (Signed)
History of hyperlipidemia/hypertriglyceridemia on fenofibrate.  Will check a lipid liver profile today.

## 2023-04-08 NOTE — Assessment & Plan Note (Signed)
History of PAF on flecainide without documented recurrence.  He does get occasional palpitations however.

## 2023-04-08 NOTE — Patient Instructions (Addendum)
Medication Instructions:  Your physician recommends that you continue on your current medications as directed. Please refer to the Current Medication list given to you today.  *If you need a refill on your cardiac medications before your next appointment, please call your pharmacy*   Lab Work: Your physician recommends that you have labs drawn today: CMET, CBC, Lipids  If you have labs (blood work) drawn today and your tests are completely normal, you will receive your results only by: MyChart Message (if you have MyChart) OR A paper copy in the mail If you have any lab test that is abnormal or we need to change your treatment, we will call you to review the results.   Follow-Up: At [facility], you and your health needs are our priority.  As part of our continuing mission to provide you with exceptional heart care, we have created designated Provider Care Teams.  These Care Teams include your primary Cardiologist (physician) and Advanced Practice Providers (APPs -  Physician Assistants and Nurse Practitioners) who all work together to provide you with the care you need, when you need it.  We recommend signing up for the patient portal called "MyChart".  Sign up information is provided on this After Visit Summary.  MyChart is used to connect with patients for Virtual Visits (Telemedicine).  Patients are able to view lab/test results, encounter notes, upcoming appointments, etc.  Non-urgent messages can be sent to your provider as well.   To learn more about what you can do with MyChart, go to [email or web address].    Your next appointment:   12 month(s)  Provider:   Nanetta Batty, MD

## 2023-04-08 NOTE — Progress Notes (Signed)
04/08/2023 Jimmy Baldwin   1970-08-08  [id number]  Primary Physician Jimmy Levins, MD Primary Cardiologist: Jimmy Gess MD Jimmy Baldwin  HPI:  Jimmy Baldwin is a 52 y.o.   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  He has a history of hyperlipidemia and strong family hx of premature CAD . I last saw in the office 07/23/2019.Marland Kitchen  His first  episode was at his desk developed a fullness in throat, then burning and pressure in his chest and rapid heart rate. He had a Latte that am and thought that was cause, but he also thought he was having a heart attack. Slowly his heart rate slowed And he felt better.Initial EKG SR without changes, ( previous cath in 2006 with normal coronary arteries). He was seen in the office on 10/31 /14 with chest pain and tachycardia palpitations. He had A. Fib with RVR. He was admitted to the hospital and underwent cardiac catheterization by Dr. Susette Baldwin revealing normal noted and normal LV function. He converted to sinus rhythm he was placed on flecainide. He saw Jimmy Baldwin in the office 01/19/14 with some palpitations but his father had recently passed away. Since that time he's been thin most part asymptomatic.   Since I saw him in the office a year ago he is remained stable.  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 He has had no recurrence of his A. fib.   Since I saw him 3-1/2 years ago he had 1 recent episode of atypical chest pain and dizziness that lasted for several minutes.  He does get rare episodes of palpitations but has not had an A-fib recurrence.  Current Meds  Medication Sig   fenofibrate 54 MG tablet Take 1 tablet (54 mg total) by mouth daily.   flecainide (TAMBOCOR) 50 MG tablet Take 1 tablet by mouth twice daily   meloxicam (MOBIC) 15 MG tablet Take 1  tablet (15 mg total) by mouth daily.   methocarbamol (ROBAXIN) 500 MG tablet Take 1 tablet (500 mg total) by mouth every 6 (six) hours as needed for muscle spasms.   metoprolol tartrate (LOPRESSOR) 100 MG tablet One tab 2 hours prior to scan   metoprolol tartrate (LOPRESSOR) 25 MG tablet Take 1 tablet (25 mg total) by mouth 2 (two) times daily. KEEP OV.   polyethylene glycol (MIRALAX / GLYCOLAX) 17 g packet Take 17 g by mouth 2 (two) times daily.   predniSONE (STERAPRED UNI-PAK 21 TAB) 10 MG (21) TBPK tablet Take by mouth daily.   zolpidem (AMBIEN) 10 MG tablet Take 1 tablet (10 mg total) by mouth at bedtime as needed for sleep.     Allergies  Allergen Reactions   Crestor [Rosuvastatin Calcium]     Joint/muscle aching   Lipitor [Atorvastatin]     Joint/muscle aching    Social History   Socioeconomic History   Marital status: Married    Spouse name: Not on file   Number of children: Not on file   Years of education: 16   Highest education level: Not on file  Occupational History   Occupation: Pastor  Tobacco Use   Smoking status: Former    Current packs/day: 0.00    Types: Cigarettes    Quit date: 01/16/2003    Years since quitting: 20.2   Smokeless tobacco:  Never  Vaping Use   Vaping status: Never Used  Substance and Sexual Activity   Alcohol use: No   Drug use: No   Sexual activity: Not on file  Other Topics Concern   Not on file  Social History Narrative   Not on file   Social Determinants of Health   Financial Resource Strain: Not on file  Food Insecurity: Not on file  Transportation Needs: Not on file  Physical Activity: Not on file  Stress: Not on file  Social Connections: Unknown (10/02/2021)   [facility]   Social Network    Social Network: Not on file  Intimate Partner Violence: Unknown (08/24/2021)   Received from [facility], [facility]   HITS    Physically Hurt: Not on file    Insult or Talk Down To: Not on file     Threaten Physical Harm: Not on file    Scream or Curse: Not on file     Review of Systems: General: negative for chills, fever, night sweats or weight changes.  Cardiovascular: negative for chest pain, dyspnea on exertion, edema, orthopnea, palpitations, paroxysmal nocturnal dyspnea or shortness of breath Dermatological: negative for rash Respiratory: negative for cough or wheezing Urologic: negative for hematuria Abdominal: negative for nausea, vomiting, diarrhea, bright red blood per rectum, melena, or hematemesis Neurologic: negative for visual changes, syncope, or dizziness All other systems reviewed and are otherwise negative except as noted above.    Blood pressure 120/62, pulse 67, height 5\' 9"  (1.753 m), weight 247 lb (112 kg), SpO2 98%.  General appearance: alert and no distress Neck: no adenopathy, no carotid bruit, no JVD, supple, symmetrical, trachea midline, and thyroid not enlarged, symmetric, no tenderness/mass/nodules Lungs: clear to auscultation bilaterally Heart: regular rate and rhythm, S1, S2 normal, no murmur, click, rub or gallop Extremities: extremities normal, atraumatic, no cyanosis or edema Pulses: 2+ and symmetric Skin: Skin color, texture, turgor normal. No rashes or lesions Neurologic: Grossly normal  EKG EKG Interpretation Date/Time:  Tuesday April 08 2023 08:30:24 EST Ventricular Rate:  67 PR Interval:  154 QRS Duration:  88 QT Interval:  396 QTC Calculation: 418 R Axis:   34  Text Interpretation: Normal sinus rhythm Normal ECG When compared with ECG of 20-Mar-2013 07:26, No significant change was found Confirmed by Jimmy Baldwin [phone]) on 04/08/2023 8:42:50 AM    ASSESSMENT AND PLAN:   Hyperlipidemia History of hyperlipidemia/hypertriglyceridemia on fenofibrate.  Will check a lipid liver profile today.  Chest pain with normal coronary angiography 2006 and 03/19/13 History of atypical chest pain with normal cath in 2006 and 2014.  He  did recently have an episode of chest pain and dizziness which lasted several minutes and resolved but these are fairly infrequent.  PAF, symptomatic- Flecainide initiated 03/19/13 History of PAF on flecainide without documented recurrence.  He does get occasional palpitations however.     Jimmy Gess MD FACP,FACC,FAHA, [facility] 04/08/2023 8:52 AM

## 2023-04-08 NOTE — Assessment & Plan Note (Signed)
History of atypical chest pain with normal cath in 2006 and 2014.  He did recently have an episode of chest pain and dizziness which lasted several minutes and resolved but these are fairly infrequent.

## 2023-04-09 LAB — CBC WITH DIFFERENTIAL/PLATELET
Basophils Absolute: 0 10*3/uL (ref 0.0–0.2)
Basos: 1 %
EOS (ABSOLUTE): 0.3 10*3/uL (ref 0.0–0.4)
Eos: 4 %
Hematocrit: 43.6 % (ref 37.5–51.0)
Hemoglobin: 14.2 g/dL (ref 13.0–17.7)
Immature Grans (Abs): 0 10*3/uL (ref 0.0–0.1)
Immature Granulocytes: 1 %
Lymphocytes Absolute: 1.7 10*3/uL (ref 0.7–3.1)
Lymphs: 29 %
MCH: 32 pg (ref 26.6–33.0)
MCHC: 32.6 g/dL (ref 31.5–35.7)
MCV: 98 fL — ABNORMAL HIGH (ref 79–97)
Monocytes Absolute: 0.6 10*3/uL (ref 0.1–0.9)
Monocytes: 9 %
Neutrophils Absolute: 3.5 10*3/uL (ref 1.4–7.0)
Neutrophils: 56 %
Platelets: 242 10*3/uL (ref 150–450)
RBC: 4.44 x10E6/uL (ref 4.14–5.80)
RDW: 11.6 % (ref 11.6–15.4)
WBC: 6.1 10*3/uL (ref 3.4–10.8)

## 2023-04-09 LAB — COMPREHENSIVE METABOLIC PANEL
ALT: 36 [IU]/L (ref 0–44)
AST: 21 [IU]/L (ref 0–40)
Albumin: 4.7 g/dL (ref 3.8–4.9)
Alkaline Phosphatase: 89 [IU]/L (ref 44–121)
BUN/Creatinine Ratio: 14 (ref 9–20)
BUN: 12 mg/dL (ref 6–24)
Bilirubin Total: 0.6 mg/dL (ref 0.0–1.2)
CO2: 25 mmol/L (ref 20–29)
Calcium: 9.5 mg/dL (ref 8.7–10.2)
Chloride: 100 mmol/L (ref 96–106)
Creatinine, Ser: 0.88 mg/dL (ref 0.76–1.27)
Globulin, Total: 2.8 g/dL (ref 1.5–4.5)
Glucose: 188 mg/dL — ABNORMAL HIGH (ref 70–99)
Potassium: 4.9 mmol/L (ref 3.5–5.2)
Sodium: 138 mmol/L (ref 134–144)
Total Protein: 7.5 g/dL (ref 6.0–8.5)
eGFR: 103 mL/min/{1.73_m2} (ref >59)

## 2023-04-09 LAB — LIPID PANEL
Chol/HDL Ratio: 6.9 ratio — ABNORMAL HIGH (ref 0.0–5.0)
Cholesterol, Total: 269 mg/dL — ABNORMAL HIGH (ref 100–199)
HDL: 39 mg/dL — ABNORMAL LOW (ref >39)
LDL Chol Calc (NIH): 189 mg/dL — ABNORMAL HIGH (ref 0–99)
Triglycerides: 212 mg/dL — ABNORMAL HIGH (ref 0–149)
VLDL Cholesterol Cal: 41 mg/dL — ABNORMAL HIGH (ref 5–40)

## 2023-04-10 ENCOUNTER — Other Ambulatory Visit: Payer: Self-pay | Admitting: *Deleted

## 2023-04-10 DIAGNOSIS — E785 Hyperlipidemia, unspecified: Secondary | ICD-10-CM

## 2023-04-15 ENCOUNTER — Ambulatory Visit (HOSPITAL_COMMUNITY)
Admission: RE | Admit: 2023-04-15 | Discharge: 2023-04-15 | Disposition: A | Discharge status: Home / Self Care | Payer: 59 | Source: Ambulatory Visit | Attending: Cardiovascular Disease | Admitting: Cardiovascular Disease

## 2023-04-15 DIAGNOSIS — E785 Hyperlipidemia, unspecified: Secondary | ICD-10-CM | POA: Insufficient documentation

## 2023-04-26 ENCOUNTER — Other Ambulatory Visit: Payer: Self-pay | Admitting: Cardiovascular Disease

## 2023-05-25 ENCOUNTER — Other Ambulatory Visit: Payer: Self-pay | Admitting: Cardiovascular Disease

## 2023-06-27 ENCOUNTER — Other Ambulatory Visit: Payer: Self-pay | Admitting: Cardiovascular Disease

## 2023-07-13 ENCOUNTER — Telehealth: Payer: Self-pay | Admitting: Student

## 2023-07-13 NOTE — Telephone Encounter (Signed)
   Patient called Answering Service requesting a medication refill. Called and spoke with patient. He states he ran out of his Lopressor 25mg  twice daily on 06/21/2023. This morning around 4am, he started to feel a little jittery but no significant palpitations. No chest pain, shortness of breath, lightheadedness/ dizziness. He states this happens every once in a while and the Lopressor helps. It looks like a 90 day prescription of Lopressor (with 3 refills) was sent in on 06/27/2023 and receipt was confirmed by Pharmacy. He states he has not received a call from his Pharmacy but is going to run by there this morning and will let me know if there are any issues.  Corrin Parker, PA-C 07/13/2023 8:16 AM

## 2023-11-15 ENCOUNTER — Emergency Department (HOSPITAL_BASED_OUTPATIENT_CLINIC_OR_DEPARTMENT_OTHER)
Admission: EM | Admit: 2023-11-15 | Discharge: 2023-11-16 | Disposition: A | Discharge status: Home / Self Care | Attending: Emergency Medicine | Admitting: Emergency Medicine

## 2023-11-15 ENCOUNTER — Encounter (HOSPITAL_BASED_OUTPATIENT_CLINIC_OR_DEPARTMENT_OTHER): Payer: Self-pay | Admitting: Emergency Medicine

## 2023-11-15 ENCOUNTER — Other Ambulatory Visit: Payer: Self-pay

## 2023-11-15 DIAGNOSIS — W448XXA Other foreign body entering into or through a natural orifice, initial encounter: Secondary | ICD-10-CM | POA: Insufficient documentation

## 2023-11-15 DIAGNOSIS — T162XXA Foreign body in left ear, initial encounter: Secondary | ICD-10-CM | POA: Insufficient documentation

## 2023-11-15 MED ORDER — LIDOCAINE VISCOUS HCL 2 % SOLUTION FOR USE IN EAR (ED/BUG EXTRACTION)
15.0000 mL | Freq: Once | OROMUCOSAL | Status: AC
  Administered 2023-11-16: 15 mL via OTIC
  Filled 2023-11-15: qty 15

## 2023-11-15 NOTE — ED Triage Notes (Signed)
 Pt thinks there is a bug in LT ear; he has put oil and hydrogen peroxide in ear; still has pain

## 2023-11-16 MED ORDER — NEOMYCIN-POLYMYXIN-HC 3.5-10000-1 OT SUSP
4.0000 [drp] | Freq: Once | OTIC | Status: AC
  Administered 2023-11-16: 4 [drp] via OTIC
  Filled 2023-11-16: qty 10

## 2023-11-16 NOTE — ED Provider Notes (Signed)
 [facility] Provider Note   CSN: [id number] Arrival date & time: 11/15/23  2228     Patient presents with: Foreign Body in Ear   Jimmy Baldwin is a 53 y.o. male.   Patient presents to the emergency department for evaluation of a bug in left ear.  Patient reports that he felt something fly into his ear while he was mowing.  He has put peroxide in the ear but nothing came out.  Patient complaining of ear pain.       Prior to Admission medications   Medication Sig Start Date End Date Taking? Authorizing Provider  fenofibrate  54 MG tablet TAKE 1 TABLET BY MOUTH DAILY 04/29/23   Court Dorn PARAS, MD  flecainide  (TAMBOCOR ) 50 MG tablet Take 1 tablet by mouth twice daily 05/27/23   Court Dorn PARAS, MD  meloxicam  (MOBIC ) 15 MG tablet Take 1 tablet (15 mg total) by mouth daily. 04/03/21   Norleen Lynwood ORN, MD  methocarbamol  (ROBAXIN ) 500 MG tablet Take 1 tablet (500 mg total) by mouth every 6 (six) hours as needed for muscle spasms. 04/03/21   Norleen Lynwood ORN, MD  metoprolol  tartrate (LOPRESSOR ) 100 MG tablet One tab 2 hours prior to scan 09/26/21   Daneen Damien BROCKS, NP  metoprolol  tartrate (LOPRESSOR ) 25 MG tablet TAKE 1 TABLET BY MOUTH TWICE A DAY . *KEEP OFFICE VISIT* 06/27/23   Court Dorn PARAS, MD  polyethylene glycol (MIRALAX  / GLYCOLAX ) 17 g packet Take 17 g by mouth 2 (two) times daily. 06/01/20   Danella Cough, PA-C  predniSONE  (STERAPRED UNI-PAK 21 TAB) 10 MG (21) TBPK tablet Take by mouth daily. 11/28/21   Henson, Vickie L, NP-C  zolpidem  (AMBIEN ) 10 MG tablet Take 1 tablet (10 mg total) by mouth at bedtime as needed for sleep. 06/13/21   Norleen Lynwood ORN, MD    Allergies: Crestor  [rosuvastatin  calcium ] and Lipitor [atorvastatin ]    Review of Systems  Updated Vital Signs BP (!) 144/91 (BP Location: Right Arm)   Pulse 72   Temp 97.7 F (36.5 C)   Resp 20   Ht 5' 9 (1.753 m)   Wt 108 kg   SpO2 98%   BMI 35.15 kg/m   Physical  Exam Constitutional:      Appearance: Normal appearance.  HENT:     Head: Normocephalic.     Left Ear: Tympanic membrane normal. A foreign body is present.   Cardiovascular:     Rate and Rhythm: Normal rate.  Pulmonary:     Effort: Pulmonary effort is normal.   Skin:    General: Skin is warm and dry.     Findings: No erythema or rash.   Neurological:     Mental Status: He is alert.     (all labs ordered are listed, but only abnormal results are displayed) Labs Reviewed - No data to display  EKG: None  Radiology: No results found.   .Foreign Body Removal  Date/Time: 11/16/2023 12:48 AM  Performed by: Haze Lonni PARAS, MD Authorized by: Haze Lonni PARAS, MD  Consent: Verbal consent obtained Risks and benefits: risks, benefits and alternatives were discussed Consent given by: patient Patient understanding: patient states understanding of the procedure being performed Site marked: the operative site was marked Imaging studies: imaging studies available Required items: required blood products, implants, devices, and special equipment available Patient identity confirmed: verbally with patient Time out: Immediately prior to procedure a time out was called to verify  the correct patient, procedure, equipment, support staff and site/side marked as required. Body area: ear Location details: left ear  Anesthesia: Local Anesthetic: lidocaine  2% without epinephrine  and topical anesthetic Anesthetic total: 3 mL  Sedation: Patient sedated: no  Patient restrained: no Patient cooperative: yes Localization method: ENT speculum and visualized Removal mechanism: curette Complexity: simple 1 objects recovered. Objects recovered: bug Post-procedure assessment: foreign body removed Patient tolerance: patient tolerated the procedure well with no immediate complications     Medications Ordered in the ED  neomycin-colistin-hydrocortisone-thonzonium (CORTISPORIN  TC) OTIC (EAR) suspension 4 drop (has no administration in time range)  lidocaine  (XYLOCAINE ) viscous 2% for use in ear (bug extraction) (15 mLs OTIC (EAR) Given 11/16/23 [id number])                                    Medical Decision Making Risk Prescription drug management.   Foreign body noted in ear.  Attempted irrigation without success.  Patient unable to tolerate alligator forcep removal.  Canal was anesthetized with lidocaine  and then foreign body was removed with ear curette.     Final diagnoses:  Foreign body of left ear, initial encounter    ED Discharge Orders     None          Aleesha Ringstad, Lonni PARAS, MD 11/16/23 [id number]

## 2024-05-05 ENCOUNTER — Other Ambulatory Visit: Payer: Self-pay | Admitting: Cardiovascular Disease

## 2024-05-15 ENCOUNTER — Other Ambulatory Visit: Payer: Self-pay | Admitting: Cardiovascular Disease

## 2024-06-01 ENCOUNTER — Other Ambulatory Visit: Payer: Self-pay | Admitting: Cardiovascular Disease

## 2024-06-06 ENCOUNTER — Other Ambulatory Visit: Payer: Self-pay | Admitting: Cardiovascular Disease

## 2024-06-10 ENCOUNTER — Telehealth: Payer: Self-pay | Admitting: Cardiovascular Disease

## 2024-06-10 NOTE — Telephone Encounter (Signed)
" °*  STAT* If patient is at the pharmacy, call can be transferred to refill team.   1. Which medications need to be refilled? (please list name of each medication and dose if known)   metoprolol  tartrate (LOPRESSOR ) 100 MG tablet  fenofibrate  54 MG tablet   2. Would you like to learn more about the convenience, safety, & potential cost savings by using the [facility]?   3. Are you open to using the [facility] (Type [facility]. ).  4. Which pharmacy/location (including street and city if local pharmacy) is medication to be sent to?  [id number] - [place], [place] - [address]   5. Do they need a 30 day or 90 day supply?   Patient stated he still has some medication.  Patient has visit scheduled with Dr. Court on 1/28. "

## 2024-06-15 MED ORDER — FENOFIBRATE 54 MG PO TABS: 54.0000 mg | ORAL_TABLET | Freq: Every day | ORAL | 0 refills | Status: AC

## 2024-06-15 NOTE — Telephone Encounter (Signed)
 Sent #15 tablets of Fenofibrate , as pt should have enough Metoprolol  until his appointment.

## 2024-06-16 ENCOUNTER — Ambulatory Visit: Admitting: Cardiovascular Disease

## 2024-06-23 ENCOUNTER — Telehealth: Payer: Self-pay | Admitting: Cardiovascular Disease

## 2024-06-23 MED ORDER — FLECAINIDE ACETATE 50 MG PO TABS: 50.0000 mg | ORAL_TABLET | Freq: Two times a day (BID) | ORAL | 0 refills | Status: AC

## 2024-06-23 NOTE — Telephone Encounter (Signed)
" °*  STAT* If patient is at the pharmacy, call can be transferred to refill team.   1. Which medications need to be refilled? (please list name of each medication and dose if known)   flecainide  (TAMBOCOR ) 50 MG tablet     2. Would you like to learn more about the convenience, safety, & potential cost savings by using the [facility]? No      3. Are you open to using the [facility] ([facility]. No    4. Which pharmacy/location (including street and city if local pharmacy) is medication to be sent to?[id number] - [place] ([place]), [place] - [address]    5. Do they need a 30 day or 90 day supply? 90 day   "

## 2024-06-23 NOTE — Telephone Encounter (Signed)
 Pt scheduled 07/13/24, refill sent.

## 2024-06-24 ENCOUNTER — Other Ambulatory Visit: Payer: Self-pay | Admitting: Cardiovascular Disease

## 2024-07-13 ENCOUNTER — Ambulatory Visit: Admitting: Cardiovascular Disease
